# Patient Record
Sex: Female | Born: 1950 | Race: White | Hispanic: No | Marital: Married | Smoking: Current every day smoker
Health system: Southern US, Community
[De-identification: ages and names within clinical notes are randomized; demographics above are authoritative.]

## PROBLEM LIST (undated history)

## (undated) DIAGNOSIS — G459 Transient cerebral ischemic attack, unspecified: Secondary | ICD-10-CM

## (undated) DIAGNOSIS — F419 Anxiety disorder, unspecified: Secondary | ICD-10-CM

## (undated) DIAGNOSIS — F32A Depression, unspecified: Secondary | ICD-10-CM

## (undated) DIAGNOSIS — J189 Pneumonia, unspecified organism: Secondary | ICD-10-CM

## (undated) DIAGNOSIS — M1990 Unspecified osteoarthritis, unspecified site: Secondary | ICD-10-CM

## (undated) DIAGNOSIS — I639 Cerebral infarction, unspecified: Secondary | ICD-10-CM

## (undated) DIAGNOSIS — E039 Hypothyroidism, unspecified: Secondary | ICD-10-CM

## (undated) DIAGNOSIS — N189 Chronic kidney disease, unspecified: Secondary | ICD-10-CM

## (undated) DIAGNOSIS — K219 Gastro-esophageal reflux disease without esophagitis: Secondary | ICD-10-CM

## (undated) DIAGNOSIS — J40 Bronchitis, not specified as acute or chronic: Secondary | ICD-10-CM

## (undated) DIAGNOSIS — I10 Essential (primary) hypertension: Secondary | ICD-10-CM

## (undated) DIAGNOSIS — R0600 Dyspnea, unspecified: Secondary | ICD-10-CM

## (undated) DIAGNOSIS — I77810 Thoracic aortic ectasia: Secondary | ICD-10-CM

## (undated) DIAGNOSIS — F329 Major depressive disorder, single episode, unspecified: Secondary | ICD-10-CM

## (undated) DIAGNOSIS — T7840XA Allergy, unspecified, initial encounter: Secondary | ICD-10-CM

## (undated) DIAGNOSIS — J449 Chronic obstructive pulmonary disease, unspecified: Secondary | ICD-10-CM

## (undated) HISTORY — DX: Allergy, unspecified, initial encounter: T78.40XA

## (undated) HISTORY — PX: CARDIAC CATHETERIZATION: SHX172

## (undated) HISTORY — PX: TOTAL SHOULDER ARTHROPLASTY: SHX126

## (undated) HISTORY — PX: THYROIDECTOMY: SHX17

## (undated) HISTORY — PX: CHOLECYSTECTOMY: SHX55

## (undated) HISTORY — DX: Chronic kidney disease, unspecified: N18.9

## (undated) HISTORY — DX: Cerebral infarction, unspecified: I63.9

## (undated) HISTORY — PX: OTHER SURGICAL HISTORY: SHX169

## (undated) HISTORY — PX: TOTAL HIP ARTHROPLASTY: SHX124

## (undated) HISTORY — PX: ABDOMINAL HYSTERECTOMY: SHX81

---

## 2009-08-12 DIAGNOSIS — I639 Cerebral infarction, unspecified: Secondary | ICD-10-CM

## 2009-08-12 HISTORY — DX: Cerebral infarction, unspecified: I63.9

## 2014-02-28 ENCOUNTER — Encounter: Payer: Self-pay | Admitting: Gastroenterology

## 2014-02-28 HISTORY — PX: COLONOSCOPY: SHX174

## 2016-06-05 DIAGNOSIS — G8929 Other chronic pain: Secondary | ICD-10-CM | POA: Insufficient documentation

## 2016-06-05 DIAGNOSIS — F17200 Nicotine dependence, unspecified, uncomplicated: Secondary | ICD-10-CM

## 2016-06-05 DIAGNOSIS — M5441 Lumbago with sciatica, right side: Secondary | ICD-10-CM

## 2016-06-05 DIAGNOSIS — M5442 Lumbago with sciatica, left side: Secondary | ICD-10-CM

## 2016-06-05 HISTORY — DX: Nicotine dependence, unspecified, uncomplicated: F17.200

## 2016-06-05 HISTORY — DX: Other chronic pain: G89.29

## 2016-07-01 DIAGNOSIS — J449 Chronic obstructive pulmonary disease, unspecified: Secondary | ICD-10-CM | POA: Insufficient documentation

## 2016-07-01 DIAGNOSIS — I10 Essential (primary) hypertension: Secondary | ICD-10-CM

## 2016-07-01 DIAGNOSIS — R7309 Other abnormal glucose: Secondary | ICD-10-CM

## 2016-07-01 DIAGNOSIS — E039 Hypothyroidism, unspecified: Secondary | ICD-10-CM

## 2016-07-01 DIAGNOSIS — G4700 Insomnia, unspecified: Secondary | ICD-10-CM

## 2016-07-01 HISTORY — DX: Essential (primary) hypertension: I10

## 2016-07-01 HISTORY — DX: Hypothyroidism, unspecified: E03.9

## 2016-07-01 HISTORY — DX: Insomnia, unspecified: G47.00

## 2016-07-01 HISTORY — DX: Other abnormal glucose: R73.09

## 2016-11-06 DIAGNOSIS — I2510 Atherosclerotic heart disease of native coronary artery without angina pectoris: Secondary | ICD-10-CM

## 2016-11-06 HISTORY — DX: Atherosclerotic heart disease of native coronary artery without angina pectoris: I25.10

## 2016-11-20 ENCOUNTER — Other Ambulatory Visit: Payer: Self-pay | Admitting: Orthopedic Surgery

## 2016-11-20 DIAGNOSIS — E785 Hyperlipidemia, unspecified: Secondary | ICD-10-CM

## 2016-11-20 HISTORY — DX: Hyperlipidemia, unspecified: E78.5

## 2016-11-26 ENCOUNTER — Ambulatory Visit (HOSPITAL_COMMUNITY)
Admission: RE | Admit: 2016-11-26 | Discharge: 2016-11-26 | Disposition: A | Discharge status: Home / Self Care | Payer: Medicare HMO | Source: Ambulatory Visit | Attending: Orthopedic Surgery | Admitting: Orthopedic Surgery

## 2016-11-26 ENCOUNTER — Encounter (HOSPITAL_COMMUNITY): Payer: Self-pay | Admitting: *Deleted

## 2016-11-26 ENCOUNTER — Encounter (HOSPITAL_COMMUNITY)
Admission: RE | Admit: 2016-11-26 | Discharge: 2016-11-26 | Disposition: A | Discharge status: Home / Self Care | Payer: Medicare HMO | Source: Ambulatory Visit | Attending: Orthopedic Surgery | Admitting: Orthopedic Surgery

## 2016-11-26 DIAGNOSIS — Z01810 Encounter for preprocedural cardiovascular examination: Secondary | ICD-10-CM | POA: Insufficient documentation

## 2016-11-26 DIAGNOSIS — Z01812 Encounter for preprocedural laboratory examination: Secondary | ICD-10-CM | POA: Diagnosis not present

## 2016-11-26 DIAGNOSIS — Z01818 Encounter for other preprocedural examination: Secondary | ICD-10-CM

## 2016-11-26 HISTORY — DX: Depression, unspecified: F32.A

## 2016-11-26 HISTORY — DX: Bronchitis, not specified as acute or chronic: J40

## 2016-11-26 HISTORY — DX: Transient cerebral ischemic attack, unspecified: G45.9

## 2016-11-26 HISTORY — DX: Gastro-esophageal reflux disease without esophagitis: K21.9

## 2016-11-26 HISTORY — DX: Dyspnea, unspecified: R06.00

## 2016-11-26 HISTORY — DX: Anxiety disorder, unspecified: F41.9

## 2016-11-26 HISTORY — DX: Hypothyroidism, unspecified: E03.9

## 2016-11-26 HISTORY — DX: Unspecified osteoarthritis, unspecified site: M19.90

## 2016-11-26 HISTORY — DX: Major depressive disorder, single episode, unspecified: F32.9

## 2016-11-26 HISTORY — DX: Pneumonia, unspecified organism: J18.9

## 2016-11-26 HISTORY — DX: Essential (primary) hypertension: I10

## 2016-11-26 HISTORY — DX: Chronic obstructive pulmonary disease, unspecified: J44.9

## 2016-11-26 HISTORY — DX: Thoracic aortic ectasia: I77.810

## 2016-11-26 LAB — CBC WITH DIFFERENTIAL/PLATELET
Basophils Absolute: 0 10*3/uL (ref 0.0–0.1)
Basophils Relative: 0 %
EOS ABS: 0.1 10*3/uL (ref 0.0–0.7)
Eosinophils Relative: 1 %
HCT: 46.3 % — ABNORMAL HIGH (ref 36.0–46.0)
HEMOGLOBIN: 15.2 g/dL — AB (ref 12.0–15.0)
LYMPHS ABS: 2.2 10*3/uL (ref 0.7–4.0)
Lymphocytes Relative: 24 %
MCH: 29.2 pg (ref 26.0–34.0)
MCHC: 32.8 g/dL (ref 30.0–36.0)
MCV: 89 fL (ref 78.0–100.0)
Monocytes Absolute: 0.6 10*3/uL (ref 0.1–1.0)
Monocytes Relative: 6 %
NEUTROS ABS: 6.4 10*3/uL (ref 1.7–7.7)
NEUTROS PCT: 69 %
Platelets: 246 10*3/uL (ref 150–400)
RBC: 5.2 MIL/uL — AB (ref 3.87–5.11)
RDW: 14.8 % (ref 11.5–15.5)
WBC: 9.2 10*3/uL (ref 4.0–10.5)

## 2016-11-26 LAB — SURGICAL PCR SCREEN
MRSA, PCR: NEGATIVE
Staphylococcus aureus: NEGATIVE

## 2016-11-26 LAB — BASIC METABOLIC PANEL
ANION GAP: 8 (ref 5–15)
BUN: 26 mg/dL — ABNORMAL HIGH (ref 6–20)
CHLORIDE: 106 mmol/L (ref 101–111)
CO2: 25 mmol/L (ref 22–32)
CREATININE: 0.77 mg/dL (ref 0.44–1.00)
Calcium: 8.7 mg/dL — ABNORMAL LOW (ref 8.9–10.3)
GFR calc non Af Amer: >60 mL/min (ref >60)
Glucose, Bld: 120 mg/dL — ABNORMAL HIGH (ref 65–99)
POTASSIUM: 3.2 mmol/L — AB (ref 3.5–5.1)
SODIUM: 139 mmol/L (ref 135–145)

## 2016-11-26 LAB — URINALYSIS, ROUTINE W REFLEX MICROSCOPIC
Bilirubin Urine: NEGATIVE
Glucose, UA: NEGATIVE mg/dL
HGB URINE DIPSTICK: NEGATIVE
KETONES UR: NEGATIVE mg/dL
LEUKOCYTES UA: NEGATIVE
Nitrite: NEGATIVE
PROTEIN: NEGATIVE mg/dL
Specific Gravity, Urine: 1.026 (ref 1.005–1.030)
pH: 5 (ref 5.0–8.0)

## 2016-11-26 LAB — PROTIME-INR
INR: 0.96
PROTHROMBIN TIME: 12.8 s (ref 11.4–15.2)

## 2016-11-26 LAB — ABO/RH: ABO/RH(D): B POS

## 2016-11-26 LAB — APTT: aPTT: 27 seconds (ref 24–36)

## 2016-11-26 NOTE — Progress Notes (Signed)
PCP - Janie Morning Cardiologist - [place]  Chest x-ray - 11/26/16 EKG - 11/20/16 reqesting Stress Test - requesting ECHO - requesting Cardiac Cath - denies    Sending to anesthesia for review of records   Patient denies shortness of breath, fever, cough and chest pain at PAT appointment   Patient verbalized understanding of instructions that was given to them at the PAT appointment. Patient expressed that there were no further questions.  Patient was also instructed that they will need to review over the PAT instructions again at home before the surgery.

## 2016-11-26 NOTE — Pre-Procedure Instructions (Signed)
Judy Cooper  11/26/2016      [facility], [place], [place] [address] Phone: [phone] Fax: [phone]    Your procedure is scheduled on April 23  Report to [facility] at 0700 A.M.  Call this number if you have problems the morning of surgery:  [phone]   Remember:  Do not eat food or drink liquids after midnight.   Take these medicines the morning of surgery with A SIP OF WATER albuterol (PROVENTIL HFA;VENTOLIN HFA), DULoxetine (CYMBALTA), esomeprazole (NEXIUM), HYDROcodone-acetaminophen (NORCO), levothyroxine (SYNTHROID, LEVOTHROID)   Take all other medications as prescribed except 7 days prior to surgery STOP taking any Aspirin, Aleve, Naproxen, Ibuprofen, Motrin, Advil, Goody's, BC's, all herbal medications, fish oil, and all vitamins    Do not wear jewelry, make-up or nail polish.  Do not wear lotions, powders, or perfumes, or deoderant.  Do not shave 48 hours prior to surgery.  Men may shave face and neck.  Do not bring valuables to the hospital.  [facility] is not responsible for any belongings or valuables.  Contacts, dentures or bridgework may not be worn into surgery.  Leave your suitcase in the car.  After surgery it may be brought to your room.  For patients admitted to the hospital, discharge time will be determined by your treatment team.  Patients discharged the day of surgery will not be allowed to drive home.    Special instructions:   - Preparing For Surgery  Before surgery, you can play an important role. Because skin is not sterile, your skin needs to be as free of germs as possible. You can reduce the number of germs on your skin by washing with CHG (chlorahexidine gluconate) Soap before surgery.  CHG is an antiseptic cleaner which kills germs and bonds with the skin to continue killing germs even after washing.  Please do not use if you have an allergy  to CHG or antibacterial soaps. If your skin becomes reddened/irritated stop using the CHG.  Do not shave (including legs and underarms) for at least 48 hours prior to first CHG shower. It is OK to shave your face.  Please follow these instructions carefully.   1. Shower the NIGHT BEFORE SURGERY and the MORNING OF SURGERY with CHG.   2. If you chose to wash your hair, wash your hair first as usual with your normal shampoo.  3. After you shampoo, rinse your hair and body thoroughly to remove the shampoo.  4. Use CHG as you would any other liquid soap. You can apply CHG directly to the skin and wash gently with a scrungie or a clean washcloth.   5. Apply the CHG Soap to your body ONLY FROM THE NECK DOWN.  Do not use on open wounds or open sores. Avoid contact with your eyes, ears, mouth and genitals (private parts). Wash genitals (private parts) with your normal soap.  6. Wash thoroughly, paying special attention to the area where your surgery will be performed.  7. Thoroughly rinse your body with warm water from the neck down.  8. DO NOT shower/wash with your normal soap after using and rinsing off the CHG Soap.  9. Pat yourself dry with a CLEAN TOWEL.   10. Wear CLEAN PAJAMAS   11. Place CLEAN SHEETS on your bed the night of your first shower and DO NOT SLEEP WITH PETS.    Day of Surgery:  Do not apply any deodorants/lotions. Please wear clean clothes to the hospital/surgery center.      Please read over the following fact sheets that you were given.

## 2016-11-27 DIAGNOSIS — M1612 Unilateral primary osteoarthritis, left hip: Secondary | ICD-10-CM

## 2016-11-27 HISTORY — DX: Unilateral primary osteoarthritis, left hip: M16.12

## 2016-11-27 NOTE — H&P (Signed)
TOTAL HIP ADMISSION H&P  Patient is admitted for left total hip arthroplasty.  Subjective:  Chief Complaint: left hip pain  HPI: Judy Cooper, 66 y.o. female, has a history of pain and functional disability in the left hip(s) due to arthritis and patient has failed non-surgical conservative treatments for greater than 12 weeks to include NSAID's and/or analgesics, use of assistive devices, weight reduction as appropriate and activity modification.  Onset of symptoms was gradual starting several years ago with gradually worsening course since that time.The patient noted no past surgery on the left hip(s).  Patient currently rates pain in the left hip at 10 out of 10 with activity. Patient has night pain, worsening of pain with activity and weight bearing, trendelenberg gait, pain that interfers with activities of daily living and pain with passive range of motion. Patient has evidence of joint space narrowing by imaging studies. This condition presents safety issues increasing the risk of falls.   There is no current active infection.  There are no active problems to display for this patient.  Past Medical History:  Diagnosis Date  . Anxiety   . Arthritis   . Bronchitis    hx  . COPD (chronic obstructive pulmonary disease) ([place])   . Depression   . Dyspnea    occasionally  . GERD (gastroesophageal reflux disease)   . Hypertension   . Hypothyroidism   . Pneumonia    hx  . TIA (transient ischemic attack)     Past Surgical History:  Procedure Laterality Date  . ABDOMINAL HYSTERECTOMY    . CHOLECYSTECTOMY    . COLONOSCOPY    . goiter surgery left side of neck    . THYROIDECTOMY    . TOTAL HIP ARTHROPLASTY Right   . TOTAL SHOULDER ARTHROPLASTY Right     No prescriptions prior to admission.   No Known Allergies  Social History  Substance Use Topics  . Smoking status: Former Research scientist (life sciences)  . Smokeless tobacco: Former Systems developer     Comment: quit 08/2016  . Alcohol use No    No family  history on file.   Review of Systems  HENT: Positive for nosebleeds.   Eyes:       Poor vision  Cardiovascular:       HTN  Gastrointestinal:       Poor appetite, bowel changes  Genitourinary: Positive for frequency and urgency.  Musculoskeletal: Positive for joint pain and myalgias.  Neurological: Positive for tremors.       Poor balance  Endo/Heme/Allergies: Positive for polydipsia. Bruises/bleeds easily.  Psychiatric/Behavioral: Positive for memory loss and suicidal ideas. The patient has insomnia.     Objective:  Physical Exam  Constitutional: She is oriented to person, place, and time. She appears well-developed and well-nourished.  HENT:  Head: Normocephalic and atraumatic.  Neck: Normal range of motion. Neck supple.  Respiratory: Effort normal.  Musculoskeletal: She exhibits tenderness.  Very irritable left hip to any attempts at internal or external rotation.  Foot tap is negative.  Neurovascularly intact distally.  Skin is intact no cuts, scrapes or abrasions.  Surgical scar to the right hip is a posterior lateral approach well-healed internal and external rotation are 30 each foot tap is negative on the right.  Neurological: She is alert and oriented to person, place, and time.  Skin: Skin is warm and dry.  Psychiatric: She has a normal mood and affect. Her behavior is normal. Judgment and thought content normal.    Vital signs in last  24 hours: Temp:  [98.4 F (36.9 C)] 98.4 F (36.9 C) (04/17 1507) Pulse Rate:  [86] 86 (04/17 1507) Resp:  [20] 20 (04/17 1507) BP: (142)/(80) 142/80 (04/17 1507) SpO2:  [95 %] 95 % (04/17 1507) Weight:  [70.9 kg (156 lb 3.2 oz)] 70.9 kg (156 lb 3.2 oz) (04/17 1507)  Labs:   Estimated body mass index is 27.67 kg/m as calculated from the following:   Height as of 11/26/16: 5\' 3"  (1.6 m).   Weight as of 11/26/16: 70.9 kg (156 lb 3.2 oz).   Imaging Review Plain radiographs demonstrate AP pelvis and crosstable lateral of the  left hip and femur show erosive bone-on-bone end-stage arthritis of the left hip which is about half inch shorter than the right hip.  Assessment/Plan:  End stage arthritis, left hip(s)  The patient history, physical examination, clinical judgement of the provider and imaging studies are consistent with end stage degenerative joint disease of the left hip(s) and total hip arthroplasty is deemed medically necessary. The treatment options including medical management, injection therapy, arthroscopy and arthroplasty were discussed at length. The risks and benefits of total hip arthroplasty were presented and reviewed. The risks due to aseptic loosening, infection, stiffness, dislocation/subluxation,  thromboembolic complications and other imponderables were discussed.  The patient acknowledged the explanation, agreed to proceed with the plan and consent was signed. Patient is being admitted for inpatient treatment for surgery, pain control, PT, OT, prophylactic antibiotics, VTE prophylaxis, progressive ambulation and ADL's and discharge planning.The patient is planning to be discharged home with home health services

## 2016-11-28 ENCOUNTER — Encounter (HOSPITAL_COMMUNITY): Payer: Self-pay

## 2016-11-28 LAB — TYPE AND SCREEN
ABO/RH(D): B POS
ANTIBODY SCREEN: NEGATIVE

## 2016-11-28 NOTE — Progress Notes (Signed)
Anesthesia chart review: Patient is a 66 year old female scheduled for left anterior hip arthroplasty on 12/02/2016 by Dr. Mayer Camel.  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  PCP is Dr. Janie Morning with [facility] ([facility]). She had recommended cardiology clearance since patient had not been seen in > 3 years.  Cardiologist is Dr. Jyl Heinz, last visit 11/20/16. He ordered a stress test and "If this is negative then she is not at high risk for coronary events during the aforementioned surgery." (Non-ischemic stress test 11/26/16.) He also ordered a chest CT to re-evaluate ascending thoracic aorta dilation (4 cm 11/25/16 CT).  Meds include albuterol, Lipitor, Cymbalta, Nexium, Norco, levothyroxine, Hyzaar, trazodone.  BP (!) 142/80   Pulse 86   Temp 36.9 C   Resp 20   Ht 5\' 3"  (1.6 m)   Wt 156 lb 3.2 oz (70.9 kg)   SpO2 95%   BMI 27.67 kg/m   EKG 11/26/16 (as part of stress test): NSR, poor r wave progression, rightward axis, non-specific ST/T abnormality.   Nuclear stress test 11/26/16 [facility]): Impressions: 1. Cardiolite images do not reveal any evidence of ischemia. 2. Reserve systolic function. The calculated EF is 62%.  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 Normal LM, LAD, LCx.  Recommendation: Work-up for non-cardiac causes of symptoms. CTA of chest to evaluate mildly dilated ascending aorta (measures 3.8 cm in cath lab).  Cardiology notes mention history of coronary angiography a few years ago-will request from [facility].  Chest x-ray 11/26/2016: IMPRESSION: No active cardiopulmonary disease.  Chest CT w/ contrast 11/25/16 [facility];  report found in PACS): Impression: 1. The ascending thoracic aorta now measures 4 cm in maximum diameter. Recommend annual imaging follow-up by CTA or MRA.  2. Tiny pulmonology nodules are identified within the right lung and is unchanged from previous exam. The 3 mm perifissural nodule is new from previous exam. No follow-up needed if patient is low risk. Noncontrast CT can be considered in 12 months the patient is high risk. 3. Diffuse bronchial wall thickening with emphysema, findings suggestive of underlying COPD.  Preoperative labs noted.  If no acute changes then I anticipate that she can proceed as planned.  [facility] Short Stay Center/Anesthesiology Phone [phone] 11/28/2016 4:48 PM

## 2016-11-29 MED ORDER — BUPIVACAINE LIPOSOME 1.3 % IJ SUSP
20.0000 mL | Freq: Once | INTRAMUSCULAR | Status: AC
  Administered 2016-12-02: 20 mL
  Filled 2016-11-29: qty 20

## 2016-11-29 MED ORDER — TRANEXAMIC ACID 1000 MG/10ML IV SOLN
2000.0000 mg | INTRAVENOUS | Status: AC
  Administered 2016-12-02: 2000 mg via TOPICAL
  Filled 2016-11-29: qty 20

## 2016-11-29 MED ORDER — TRANEXAMIC ACID 1000 MG/10ML IV SOLN
1000.0000 mg | INTRAVENOUS | Status: AC
  Administered 2016-12-02: 1000 mg via INTRAVENOUS
  Filled 2016-11-29: qty 10

## 2016-12-01 NOTE — Anesthesia Preprocedure Evaluation (Addendum)
Anesthesia Evaluation  Patient identified by MRN, date of birth, ID band Patient awake    Reviewed: Allergy & Precautions, H&P , NPO status , Patient's Chart, lab work & pertinent test results  Airway Mallampati: II  TM Distance: >3 FB Neck ROM: Full    Dental no notable dental hx. (+) Edentulous Upper, Partial Lower, Dental Advisory Given   Pulmonary COPD,  COPD inhaler, former smoker,    Pulmonary exam normal breath sounds clear to auscultation- rhonchi       Cardiovascular Exercise Tolerance: Good hypertension, Pt. on medications + Peripheral Vascular Disease   Rhythm:Regular Rate:Normal     Neuro/Psych Anxiety Depression TIAnegative psych ROS   GI/Hepatic Neg liver ROS, GERD  Medicated and Controlled,  Endo/Other  Hypothyroidism   Renal/GU negative Renal ROS  negative genitourinary   Musculoskeletal  (+) Arthritis , Osteoarthritis,    Abdominal   Peds  Hematology negative hematology ROS (+)   Anesthesia Other Findings   Reproductive/Obstetrics negative OB ROS                            Anesthesia Physical Anesthesia Plan  ASA: III  Anesthesia Plan: MAC and Spinal   Post-op Pain Management:    Induction: Intravenous  Airway Management Planned: Simple Face Mask  Additional Equipment:   Intra-op Plan:   Post-operative Plan:   Informed Consent: I have reviewed the patients History and Physical, chart, labs and discussed the procedure including the risks, benefits and alternatives for the proposed anesthesia with the patient or authorized representative who has indicated his/her understanding and acceptance.   Dental advisory given  Plan Discussed with: CRNA  Anesthesia Plan Comments:        Anesthesia Quick Evaluation

## 2016-12-02 ENCOUNTER — Inpatient Hospital Stay (HOSPITAL_COMMUNITY): Payer: Medicare HMO | Admitting: Anesthesiology

## 2016-12-02 ENCOUNTER — Inpatient Hospital Stay (HOSPITAL_COMMUNITY)
Admission: RE | Admit: 2016-12-02 | Discharge: 2016-12-04 | DRG: 470 | Disposition: A | Discharge status: Home / Self Care | Payer: Medicare HMO | Source: Ambulatory Visit | Attending: Orthopedic Surgery | Admitting: Orthopedic Surgery

## 2016-12-02 ENCOUNTER — Inpatient Hospital Stay (HOSPITAL_COMMUNITY): Payer: Medicare HMO

## 2016-12-02 ENCOUNTER — Encounter (HOSPITAL_COMMUNITY): Payer: Self-pay

## 2016-12-02 ENCOUNTER — Inpatient Hospital Stay (HOSPITAL_COMMUNITY): Payer: Medicare HMO | Admitting: Emergency Medicine

## 2016-12-02 ENCOUNTER — Encounter (HOSPITAL_COMMUNITY)
Admission: RE | Disposition: A | Discharge status: Home / Self Care | Payer: Self-pay | Source: Ambulatory Visit | Attending: Orthopedic Surgery

## 2016-12-02 DIAGNOSIS — I77819 Aortic ectasia, unspecified site: Secondary | ICD-10-CM | POA: Diagnosis present

## 2016-12-02 DIAGNOSIS — K219 Gastro-esophageal reflux disease without esophagitis: Secondary | ICD-10-CM | POA: Diagnosis present

## 2016-12-02 DIAGNOSIS — R918 Other nonspecific abnormal finding of lung field: Secondary | ICD-10-CM | POA: Insufficient documentation

## 2016-12-02 DIAGNOSIS — M1612 Unilateral primary osteoarthritis, left hip: Principal | ICD-10-CM | POA: Diagnosis present

## 2016-12-02 DIAGNOSIS — Z8673 Personal history of transient ischemic attack (TIA), and cerebral infarction without residual deficits: Secondary | ICD-10-CM | POA: Diagnosis not present

## 2016-12-02 DIAGNOSIS — Z8701 Personal history of pneumonia (recurrent): Secondary | ICD-10-CM | POA: Diagnosis not present

## 2016-12-02 DIAGNOSIS — Z90710 Acquired absence of both cervix and uterus: Secondary | ICD-10-CM | POA: Diagnosis not present

## 2016-12-02 DIAGNOSIS — D62 Acute posthemorrhagic anemia: Secondary | ICD-10-CM | POA: Diagnosis not present

## 2016-12-02 DIAGNOSIS — Z87891 Personal history of nicotine dependence: Secondary | ICD-10-CM

## 2016-12-02 DIAGNOSIS — Z96641 Presence of right artificial hip joint: Secondary | ICD-10-CM | POA: Diagnosis present

## 2016-12-02 DIAGNOSIS — J449 Chronic obstructive pulmonary disease, unspecified: Secondary | ICD-10-CM | POA: Diagnosis present

## 2016-12-02 DIAGNOSIS — Z885 Allergy status to narcotic agent status: Secondary | ICD-10-CM

## 2016-12-02 DIAGNOSIS — I739 Peripheral vascular disease, unspecified: Secondary | ICD-10-CM | POA: Diagnosis present

## 2016-12-02 DIAGNOSIS — Z96611 Presence of right artificial shoulder joint: Secondary | ICD-10-CM | POA: Diagnosis present

## 2016-12-02 DIAGNOSIS — E890 Postprocedural hypothyroidism: Secondary | ICD-10-CM | POA: Diagnosis present

## 2016-12-02 DIAGNOSIS — Z419 Encounter for procedure for purposes other than remedying health state, unspecified: Secondary | ICD-10-CM

## 2016-12-02 DIAGNOSIS — I10 Essential (primary) hypertension: Secondary | ICD-10-CM | POA: Diagnosis present

## 2016-12-02 DIAGNOSIS — F329 Major depressive disorder, single episode, unspecified: Secondary | ICD-10-CM | POA: Diagnosis present

## 2016-12-02 DIAGNOSIS — G8918 Other acute postprocedural pain: Secondary | ICD-10-CM

## 2016-12-02 DIAGNOSIS — I2510 Atherosclerotic heart disease of native coronary artery without angina pectoris: Secondary | ICD-10-CM | POA: Diagnosis present

## 2016-12-02 DIAGNOSIS — F419 Anxiety disorder, unspecified: Secondary | ICD-10-CM | POA: Diagnosis present

## 2016-12-02 DIAGNOSIS — Z9049 Acquired absence of other specified parts of digestive tract: Secondary | ICD-10-CM | POA: Diagnosis not present

## 2016-12-02 HISTORY — DX: Unilateral primary osteoarthritis, left hip: M16.12

## 2016-12-02 HISTORY — DX: Other nonspecific abnormal finding of lung field: R91.8

## 2016-12-02 HISTORY — PX: TOTAL HIP ARTHROPLASTY: SHX124

## 2016-12-02 SURGERY — ARTHROPLASTY, HIP, TOTAL, ANTERIOR APPROACH
Anesthesia: Monitor Anesthesia Care | Laterality: Left

## 2016-12-02 MED ORDER — FLEET ENEMA 7-19 GM/118ML RE ENEM: 1.0000 | ENEMA | Freq: Once | RECTAL | Status: DC | PRN

## 2016-12-02 MED ORDER — LACTATED RINGERS IV SOLN
INTRAVENOUS | Status: DC
Start: 2016-12-02 — End: 2016-12-02
  Administered 2016-12-02 (×2): via INTRAVENOUS

## 2016-12-02 MED ORDER — EPHEDRINE 5 MG/ML INJ
INTRAVENOUS | Status: AC
  Filled 2016-12-02: qty 20

## 2016-12-02 MED ORDER — OXYCODONE HCL 5 MG PO TABS
ORAL_TABLET | ORAL | Status: AC
  Filled 2016-12-02: qty 2

## 2016-12-02 MED ORDER — HYDROMORPHONE HCL 1 MG/ML IJ SOLN
1.0000 mg | INTRAMUSCULAR | Status: DC | PRN
  Administered 2016-12-02 (×2): 1 mg via INTRAVENOUS
  Filled 2016-12-02 (×3): qty 1

## 2016-12-02 MED ORDER — MIDAZOLAM HCL 2 MG/2ML IJ SOLN
INTRAMUSCULAR | Status: AC
  Filled 2016-12-02: qty 2

## 2016-12-02 MED ORDER — EPHEDRINE SULFATE 50 MG/ML IJ SOLN
INTRAMUSCULAR | Status: DC | PRN
  Administered 2016-12-02 (×2): 5 mg via INTRAVENOUS

## 2016-12-02 MED ORDER — LOSARTAN POTASSIUM-HCTZ 50-12.5 MG PO TABS: 1.0000 | ORAL_TABLET | Freq: Every day | ORAL | Status: DC

## 2016-12-02 MED ORDER — PHENYLEPHRINE HCL 10 MG/ML IJ SOLN
INTRAMUSCULAR | Status: DC | PRN
  Administered 2016-12-02: 30 ug/min via INTRAVENOUS

## 2016-12-02 MED ORDER — LEVOTHYROXINE SODIUM 25 MCG PO TABS
25.0000 ug | ORAL_TABLET | Freq: Every day | ORAL | Status: DC
  Administered 2016-12-03 – 2016-12-04 (×2): 25 ug via ORAL
  Filled 2016-12-02 (×2): qty 1

## 2016-12-02 MED ORDER — METOCLOPRAMIDE HCL 5 MG PO TABS
5.0000 mg | ORAL_TABLET | Freq: Three times a day (TID) | ORAL | Status: DC | PRN
  Administered 2016-12-02: 10 mg via ORAL
  Filled 2016-12-02: qty 2

## 2016-12-02 MED ORDER — DIPHENHYDRAMINE HCL 12.5 MG/5ML PO ELIX: 12.5000 mg | ORAL_SOLUTION | ORAL | Status: DC | PRN

## 2016-12-02 MED ORDER — ALBUTEROL SULFATE (2.5 MG/3ML) 0.083% IN NEBU: 3.0000 mL | INHALATION_SOLUTION | Freq: Four times a day (QID) | RESPIRATORY_TRACT | Status: DC | PRN

## 2016-12-02 MED ORDER — CHLORHEXIDINE GLUCONATE 4 % EX LIQD: 60.0000 mL | Freq: Once | CUTANEOUS | Status: DC

## 2016-12-02 MED ORDER — ONDANSETRON HCL 4 MG/2ML IJ SOLN: 4.0000 mg | Freq: Four times a day (QID) | INTRAMUSCULAR | Status: DC | PRN

## 2016-12-02 MED ORDER — FENTANYL CITRATE (PF) 250 MCG/5ML IJ SOLN
INTRAMUSCULAR | Status: AC
  Filled 2016-12-02: qty 5

## 2016-12-02 MED ORDER — KCL IN DEXTROSE-NACL 20-5-0.45 MEQ/L-%-% IV SOLN: INTRAVENOUS | Status: DC

## 2016-12-02 MED ORDER — PHENOL 1.4 % MT LIQD: 1.0000 | OROMUCOSAL | Status: DC | PRN

## 2016-12-02 MED ORDER — DULOXETINE HCL 30 MG PO CPEP
30.0000 mg | ORAL_CAPSULE | Freq: Every day | ORAL | Status: DC
  Administered 2016-12-02 – 2016-12-04 (×3): 30 mg via ORAL
  Filled 2016-12-02 (×3): qty 1

## 2016-12-02 MED ORDER — FENTANYL CITRATE (PF) 100 MCG/2ML IJ SOLN
50.0000 ug | INTRAMUSCULAR | Status: AC | PRN
  Administered 2016-12-02 (×2): 50 ug via INTRAVENOUS

## 2016-12-02 MED ORDER — MENTHOL 3 MG MT LOZG: 1.0000 | LOZENGE | OROMUCOSAL | Status: DC | PRN

## 2016-12-02 MED ORDER — ONDANSETRON HCL 4 MG PO TABS: 4.0000 mg | ORAL_TABLET | Freq: Four times a day (QID) | ORAL | Status: DC | PRN

## 2016-12-02 MED ORDER — HYDROCHLOROTHIAZIDE 25 MG PO TABS
25.0000 mg | ORAL_TABLET | Freq: Every day | ORAL | Status: DC
  Administered 2016-12-03 – 2016-12-04 (×2): 25 mg via ORAL
  Filled 2016-12-02 (×2): qty 1

## 2016-12-02 MED ORDER — BISACODYL 5 MG PO TBEC: 5.0000 mg | DELAYED_RELEASE_TABLET | Freq: Every day | ORAL | Status: DC | PRN

## 2016-12-02 MED ORDER — OXYCODONE-ACETAMINOPHEN 5-325 MG PO TABS: 1.0000 | ORAL_TABLET | ORAL | 0 refills | Status: DC | PRN

## 2016-12-02 MED ORDER — ACETAMINOPHEN 650 MG RE SUPP: 650.0000 mg | Freq: Four times a day (QID) | RECTAL | Status: DC | PRN

## 2016-12-02 MED ORDER — ASPIRIN EC 325 MG PO TBEC
325.0000 mg | DELAYED_RELEASE_TABLET | Freq: Every day | ORAL | Status: DC
  Administered 2016-12-03 – 2016-12-04 (×2): 325 mg via ORAL
  Filled 2016-12-02 (×2): qty 1

## 2016-12-02 MED ORDER — LIDOCAINE 2% (20 MG/ML) 5 ML SYRINGE
INTRAMUSCULAR | Status: AC
  Filled 2016-12-02: qty 10

## 2016-12-02 MED ORDER — BUPIVACAINE IN DEXTROSE 0.75-8.25 % IT SOLN
INTRATHECAL | Status: DC | PRN
  Administered 2016-12-02: 15 mg via INTRATHECAL

## 2016-12-02 MED ORDER — POLYETHYLENE GLYCOL 3350 17 G PO PACK: 17.0000 g | PACK | Freq: Every day | ORAL | Status: DC | PRN

## 2016-12-02 MED ORDER — FENTANYL CITRATE (PF) 100 MCG/2ML IJ SOLN
INTRAMUSCULAR | Status: AC
  Administered 2016-12-02: 50 ug via INTRAVENOUS
  Filled 2016-12-02: qty 2

## 2016-12-02 MED ORDER — PHENYLEPHRINE 40 MCG/ML (10ML) SYRINGE FOR IV PUSH (FOR BLOOD PRESSURE SUPPORT)
PREFILLED_SYRINGE | INTRAVENOUS | Status: AC
  Filled 2016-12-02: qty 30

## 2016-12-02 MED ORDER — ALUMINUM HYDROXIDE GEL 320 MG/5ML PO SUSP
15.0000 mL | ORAL | Status: DC | PRN
  Filled 2016-12-02: qty 30

## 2016-12-02 MED ORDER — OXYCODONE HCL 5 MG PO TABS
ORAL_TABLET | ORAL | Status: AC
  Administered 2016-12-02: 10 mg via ORAL
  Filled 2016-12-02: qty 1

## 2016-12-02 MED ORDER — METOCLOPRAMIDE HCL 5 MG/ML IJ SOLN: 5.0000 mg | Freq: Three times a day (TID) | INTRAMUSCULAR | Status: DC | PRN

## 2016-12-02 MED ORDER — SUCCINYLCHOLINE CHLORIDE 200 MG/10ML IV SOSY
PREFILLED_SYRINGE | INTRAVENOUS | Status: AC
  Filled 2016-12-02: qty 10

## 2016-12-02 MED ORDER — PROPOFOL 500 MG/50ML IV EMUL
INTRAVENOUS | Status: DC | PRN
  Administered 2016-12-02: 50 ug/kg/min via INTRAVENOUS

## 2016-12-02 MED ORDER — METHOCARBAMOL 500 MG PO TABS
500.0000 mg | ORAL_TABLET | Freq: Four times a day (QID) | ORAL | Status: DC | PRN
Start: 2016-12-02 — End: 2016-12-04
  Administered 2016-12-02 – 2016-12-04 (×5): 500 mg via ORAL
  Filled 2016-12-02 (×5): qty 1

## 2016-12-02 MED ORDER — DEXTROSE-NACL 5-0.45 % IV SOLN: INTRAVENOUS | Status: DC

## 2016-12-02 MED ORDER — GABAPENTIN 300 MG PO CAPS
300.0000 mg | ORAL_CAPSULE | Freq: Three times a day (TID) | ORAL | Status: DC
Start: 2016-12-02 — End: 2016-12-04
  Administered 2016-12-02 – 2016-12-04 (×6): 300 mg via ORAL
  Filled 2016-12-02 (×6): qty 1

## 2016-12-02 MED ORDER — 0.9 % SODIUM CHLORIDE (POUR BTL) OPTIME
TOPICAL | Status: DC | PRN
  Administered 2016-12-02: 1000 mL

## 2016-12-02 MED ORDER — DOCUSATE SODIUM 100 MG PO CAPS
100.0000 mg | ORAL_CAPSULE | Freq: Two times a day (BID) | ORAL | Status: DC
  Administered 2016-12-02 – 2016-12-04 (×5): 100 mg via ORAL
  Filled 2016-12-02 (×5): qty 1

## 2016-12-02 MED ORDER — TRAZODONE HCL 100 MG PO TABS: 100.0000 mg | ORAL_TABLET | Freq: Every evening | ORAL | Status: DC | PRN

## 2016-12-02 MED ORDER — CEFAZOLIN SODIUM-DEXTROSE 2-4 GM/100ML-% IV SOLN
2.0000 g | INTRAVENOUS | Status: AC
  Administered 2016-12-02: 2 g via INTRAVENOUS
  Filled 2016-12-02: qty 100

## 2016-12-02 MED ORDER — ROCURONIUM BROMIDE 10 MG/ML (PF) SYRINGE
PREFILLED_SYRINGE | INTRAVENOUS | Status: AC
  Filled 2016-12-02: qty 5

## 2016-12-02 MED ORDER — METHOCARBAMOL 1000 MG/10ML IJ SOLN
500.0000 mg | Freq: Four times a day (QID) | INTRAVENOUS | Status: DC | PRN
  Filled 2016-12-02: qty 5

## 2016-12-02 MED ORDER — LOSARTAN POTASSIUM 50 MG PO TABS
50.0000 mg | ORAL_TABLET | Freq: Every day | ORAL | Status: DC
  Administered 2016-12-03 – 2016-12-04 (×2): 50 mg via ORAL
  Filled 2016-12-02 (×2): qty 1

## 2016-12-02 MED ORDER — OXYCODONE HCL 5 MG PO TABS
5.0000 mg | ORAL_TABLET | ORAL | Status: DC | PRN
  Administered 2016-12-02 – 2016-12-04 (×9): 10 mg via ORAL
  Filled 2016-12-02 (×8): qty 2

## 2016-12-02 MED ORDER — PHENYLEPHRINE HCL 10 MG/ML IJ SOLN
INTRAMUSCULAR | Status: DC | PRN
  Administered 2016-12-02: 80 ug via INTRAVENOUS
  Administered 2016-12-02: 120 ug via INTRAVENOUS
  Administered 2016-12-02: 40 ug via INTRAVENOUS
  Administered 2016-12-02 (×2): 80 ug via INTRAVENOUS

## 2016-12-02 MED ORDER — MIDAZOLAM HCL 5 MG/5ML IJ SOLN
INTRAMUSCULAR | Status: DC | PRN
  Administered 2016-12-02: 2 mg via INTRAVENOUS

## 2016-12-02 MED ORDER — DEXAMETHASONE SODIUM PHOSPHATE 10 MG/ML IJ SOLN
10.0000 mg | Freq: Once | INTRAMUSCULAR | Status: AC
  Administered 2016-12-03: 10 mg via INTRAVENOUS
  Filled 2016-12-02: qty 1

## 2016-12-02 MED ORDER — ACETAMINOPHEN 325 MG PO TABS
650.0000 mg | ORAL_TABLET | Freq: Four times a day (QID) | ORAL | Status: DC | PRN
  Administered 2016-12-02 – 2016-12-04 (×3): 650 mg via ORAL
  Filled 2016-12-02 (×3): qty 2

## 2016-12-02 MED ORDER — PANTOPRAZOLE SODIUM 40 MG PO TBEC
80.0000 mg | DELAYED_RELEASE_TABLET | Freq: Every day | ORAL | Status: DC
  Administered 2016-12-03: 80 mg via ORAL
  Filled 2016-12-02: qty 2

## 2016-12-02 MED ORDER — ASPIRIN EC 325 MG PO TBEC: 325.0000 mg | DELAYED_RELEASE_TABLET | Freq: Two times a day (BID) | ORAL | 0 refills | Status: DC

## 2016-12-02 MED ORDER — BUPIVACAINE-EPINEPHRINE (PF) 0.5% -1:200000 IJ SOLN
INTRAMUSCULAR | Status: DC | PRN
  Administered 2016-12-02: 50 mL via PERINEURAL

## 2016-12-02 MED ORDER — BUPIVACAINE HCL (PF) 0.5 % IJ SOLN
INTRAMUSCULAR | Status: AC
  Filled 2016-12-02: qty 60

## 2016-12-02 MED ORDER — EPINEPHRINE PF 1 MG/ML IJ SOLN
INTRAMUSCULAR | Status: AC
  Filled 2016-12-02: qty 1

## 2016-12-02 MED ORDER — HYDROMORPHONE HCL 1 MG/ML IJ SOLN: 0.2500 mg | INTRAMUSCULAR | Status: DC | PRN

## 2016-12-02 MED ORDER — TIZANIDINE HCL 2 MG PO TABS: 2.0000 mg | ORAL_TABLET | Freq: Four times a day (QID) | ORAL | 0 refills | Status: DC | PRN

## 2016-12-02 SURGICAL SUPPLY — 43 items
BAG DECANTER FOR FLEXI CONT (MISCELLANEOUS) ×3 IMPLANT
BLADE SAW SGTL 18X1.27X75 (BLADE) ×2 IMPLANT
BLADE SAW SGTL 18X1.27X75MM (BLADE) ×1
CAPT HIP TOTAL 2 ×3 IMPLANT
COVER PERINEAL POST (MISCELLANEOUS) ×3 IMPLANT
COVER SURGICAL LIGHT HANDLE (MISCELLANEOUS) ×3 IMPLANT
DRAPE C-ARM 42X72 X-RAY (DRAPES) ×3 IMPLANT
DRAPE STERI IOBAN 125X83 (DRAPES) ×3 IMPLANT
DRAPE U-SHAPE 47X51 STRL (DRAPES) ×6 IMPLANT
DRSG AQUACEL AG ADV 3.5X10 (GAUZE/BANDAGES/DRESSINGS) ×3 IMPLANT
DURAPREP 26ML APPLICATOR (WOUND CARE) ×3 IMPLANT
ELECT BLADE 4.0 EZ CLEAN MEGAD (MISCELLANEOUS) ×3
ELECT REM PT RETURN 9FT ADLT (ELECTROSURGICAL) ×3
ELECTRODE BLDE 4.0 EZ CLN MEGD (MISCELLANEOUS) ×1 IMPLANT
ELECTRODE REM PT RTRN 9FT ADLT (ELECTROSURGICAL) ×1 IMPLANT
FACESHIELD WRAPAROUND (MASK) ×6 IMPLANT
GLOVE BIO SURGEON STRL SZ7.5 (GLOVE) ×3 IMPLANT
GLOVE BIO SURGEON STRL SZ8.5 (GLOVE) ×3 IMPLANT
GLOVE BIOGEL PI IND STRL 8 (GLOVE) ×1 IMPLANT
GLOVE BIOGEL PI IND STRL 9 (GLOVE) ×1 IMPLANT
GLOVE BIOGEL PI INDICATOR 8 (GLOVE) ×2
GLOVE BIOGEL PI INDICATOR 9 (GLOVE) ×2
GOWN STRL REUS W/ TWL LRG LVL3 (GOWN DISPOSABLE) ×1 IMPLANT
GOWN STRL REUS W/ TWL XL LVL3 (GOWN DISPOSABLE) ×2 IMPLANT
GOWN STRL REUS W/TWL LRG LVL3 (GOWN DISPOSABLE) ×2
GOWN STRL REUS W/TWL XL LVL3 (GOWN DISPOSABLE) ×4
KIT BASIN OR (CUSTOM PROCEDURE TRAY) ×3 IMPLANT
KIT ROOM TURNOVER OR (KITS) ×3 IMPLANT
MANIFOLD NEPTUNE II (INSTRUMENTS) ×3 IMPLANT
NEEDLE HYPO 22GX1.5 SAFETY (NEEDLE) ×6 IMPLANT
NS IRRIG 1000ML POUR BTL (IV SOLUTION) ×3 IMPLANT
PACK TOTAL JOINT (CUSTOM PROCEDURE TRAY) ×3 IMPLANT
PAD ARMBOARD 7.5X6 YLW CONV (MISCELLANEOUS) ×6 IMPLANT
SUT VIC AB 1 CTX 36 (SUTURE) ×2
SUT VIC AB 1 CTX36XBRD ANBCTR (SUTURE) ×1 IMPLANT
SUT VIC AB 2-0 CT1 27 (SUTURE) ×2
SUT VIC AB 2-0 CT1 TAPERPNT 27 (SUTURE) ×1 IMPLANT
SUT VIC AB 3-0 PS2 18 (SUTURE) ×2
SUT VIC AB 3-0 PS2 18XBRD (SUTURE) ×1 IMPLANT
SYR CONTROL 10ML LL (SYRINGE) ×6 IMPLANT
TOWEL OR 17X24 6PK STRL BLUE (TOWEL DISPOSABLE) IMPLANT
TOWEL OR 17X26 10 PK STRL BLUE (TOWEL DISPOSABLE) IMPLANT
TRAY CATH 16FR W/PLASTIC CATH (SET/KITS/TRAYS/PACK) IMPLANT

## 2016-12-02 NOTE — Discharge Instructions (Signed)

## 2016-12-02 NOTE — Evaluation (Signed)
Physical Therapy Evaluation Patient Details Name: Judy Cooper MRN: [id number] DOB: [birth year] Today's Date: 12/02/2016   History of Present Illness  Admitted for LTHA, WBAT;  has a past medical history of Anxiety; Arthritis; Bronchitis; COPD (chronic obstructive pulmonary disease) ([place]); Depression; and TIA (transient ischemic attack).  has a past surgical history that includes Cholecystectomy; Total hip arthroplasty (Right); Total shoulder arthroplasty (Right)  Clinical Impression   Pt is s/p THA resulting in the deficits listed below (see PT Problem List). In a lot of pain on PT eval, but still wanting to get up and OOB; Notable decr coordination with taking steps, likely lingering effect of spinal anesthesia -- anticipate this will be short-lived;  Pt will benefit from skilled PT to increase their independence and safety with mobility to allow discharge to the venue listed below.      Follow Up Recommendations Home health PT;Supervision/Assistance - 24 hour (or per Ortho plan)    Equipment Recommendations  Rolling walker with 5" wheels;3in1 (PT)    Recommendations for Other Services       Precautions / Restrictions Precautions Precautions: Fall Precaution Comments: Unsteadiness likely due to residual effects of spinal; I anticipate this will improve Restrictions Weight Bearing Restrictions: No      Mobility  Bed Mobility Overal bed mobility: Needs Assistance Bed Mobility: Supine to Sit     Supine to sit: Min guard     General bed mobility comments: Cues for technique; used rails  Transfers Overall transfer level: Needs assistance Equipment used: Rolling walker (2 wheeled) Transfers: Sit to/from Stand Sit to Stand: Min assist         General transfer comment: Min assist to steady; cues for hand placement, technqiue, and safety  Ambulation/Gait Ambulation/Gait assistance: Min assist Ambulation Distance (Feet):  (pivot steps bed to chair) Assistive device:  Rolling walker (2 wheeled) Gait Pattern/deviations: Shuffle     General Gait Details: Very uncoordinated steps (once stepping L foot onto R foot) while making her way to chair; min assist for steadiness  Stairs            Wheelchair Mobility    Modified Rankin (Stroke Patients Only)       Balance Overall balance assessment: Needs assistance   Sitting balance-Leahy Scale: Good       Standing balance-Leahy Scale: Poor Standing balance comment: Dependent on UE support                             Pertinent Vitals/Pain Pain Assessment: 0-10 Pain Score: 7  Pain Location: L hip Pain Descriptors / Indicators: Aching Pain Intervention(s): Monitored during session;Patient requesting pain meds-RN notified    Home Living Family/patient expects to be discharged to:: Private residence Living Arrangements: Spouse/significant other Available Help at Discharge: Family;Available 24 hours/day Type of Home: House Home Access: Stairs to enter Entrance Stairs-Rails: Right Entrance Stairs-Number of Steps: 3 Home Layout: One level        Prior Function Level of Independence: Independent               Hand Dominance        Extremity/Trunk Assessment   Upper Extremity Assessment Upper Extremity Assessment: Overall WFL for tasks assessed    Lower Extremity Assessment Lower Extremity Assessment: LLE deficits/detail (uncoordinated stepping, likely from spinal anesthesia) LLE Deficits / Details: Grossly decr AROM and strength, limited by pain LLE: Unable to fully assess due to pain  Communication   Communication: No difficulties  Cognition Arousal/Alertness: Awake/alert Behavior During Therapy: WFL for tasks assessed/performed Overall Cognitive Status: Within Functional Limits for tasks assessed                                        General Comments      Exercises     Assessment/Plan    PT Assessment Patient needs  continued PT services  PT Problem List Decreased strength;Decreased range of motion;Decreased activity tolerance;Decreased balance;Decreased mobility;Decreased coordination;Decreased knowledge of use of DME;Decreased safety awareness;Decreased knowledge of precautions;Pain       PT Treatment Interventions DME instruction;Gait training;Stair training;Functional mobility training;Therapeutic activities;Therapeutic exercise;Patient/family education;Balance training    PT Goals (Current goals can be found in the Care Plan section)  Acute Rehab PT Goals Patient Stated Goal: to chase granddaughter PT Goal Formulation: With patient Time For Goal Achievement: 12/09/16 Potential to Achieve Goals: Good    Frequency 7X/week   Barriers to discharge        Co-evaluation               End of Session Equipment Utilized During Treatment: Gait belt Activity Tolerance: Patient tolerated treatment well Patient left: in chair;with call bell/phone within reach Nurse Communication: Mobility status;Patient requests pain meds PT Visit Diagnosis: Unsteadiness on feet (R26.81);Pain Pain - Right/Left: Left Pain - part of body: Hip    Time: 8832-5498 PT Time Calculation (min) (ACUTE ONLY): 17 min   Charges:   PT Evaluation $PT Eval Low Complexity: 1 Procedure     PT G Codes:        Roney Marion, PT  Acute Rehabilitation Services Pager [phone] Office [phone]   [place] 12/02/2016, 4:51 PM

## 2016-12-02 NOTE — Op Note (Signed)
OPERATIVE REPORT    DATE OF PROCEDURE:  12/02/2016       PREOPERATIVE DIAGNOSIS:  LEFT HIP OSTEOARTHRITIS                                                          POSTOPERATIVE DIAGNOSIS:  LEFT HIP OSTEOARTHRITIS                                                            PROCEDURE: Anterior L  total hip arthroplasty using a 50 mm DePuy Pinnacle  Cup, [employer], 0-degree polyethylene liner, a +4 32 mm ceramic head, a 3 strd Depuy Triloc stem   SURGEON: ZOXWR,UEAVW J    ASSISTANT:   [employer]  (present throughout entire procedure and necessary for timely completion of the procedure)   ANESTHESIA: Spinal BLOOD LOSS: 400cc FLUID REPLACEMENT: 1500 crystalloid Antibiotic: 2gm ancef Tranexamic Acid: 1gm iv, 2gm topical COMPLICATIONS: none    INDICATIONS FOR PROCEDURE: A 66 y.o. year-old With  [place], with protrusioo  for 3 years, x-rays show bone-on-bone arthritic changes, and osteophytes. Despite conservative measures with observation, anti-inflammatory medicine, narcotics, use of a cane, has severe unremitting pain and can ambulate only a few blocks before resting. Patient desires elective L total hip arthroplasty to decrease pain and increase function. The risks, benefits, and alternatives were discussed at length including but not limited to the risks of infection, bleeding, nerve injury, stiffness, blood clots, the need for revision surgery, cardiopulmonary complications, among others, and they were willing to proceed. Questions answered     PROCEDURE IN DETAIL: The patient was identified by armband,  received preoperative IV antibiotics in the holding area at [facility], taken to the operating room , appropriate anesthetic monitors  were attached and  anesthesia was induced with the patienton the gurney. The HANA boots were applied to the feet and he was then transferred to the HANA table with a peroneal post and support underneath the  non-operative le, which was locked in 5 lb traction. Theoperative lower extremity was then prepped and draped in the usual sterile fashion from just above the iliac crest to the knee. And a timeout procedure was performed. We then made a 10 cm incision along the interval at the leading edge of the tensor fascia lata of starting at 2 cm lateral to and 2 cm distal to the ASIS. Small bleeders in the skin and subcutaneous tissue identified and cauterized we dissected down to the fascia and made an incision in the fascia allowing [place] to elevate the fascia of the tensor muscle and exploited the interval between the rectus and the tensor fascia lata. A Hohmann retractor was then placed along the superior neck of the femur and a Cobra retractor along the inferior neck of the femur we teed the capsule starting out at the superior anterior aspect of the acetabulum going distally and made the T along the neck both leaflets of the T were tagged with #2 Ethibond suture. Cobra retractors were then placed along the inferior and superior neck allowing [place] to perform a standard  neck cut and removed the femoral head with a power corkscrew. We then placed a right angle Hohmann retractor along the anterior aspect of the acetabulum a spiked Cobra in the cotyloid notch and posteriorly a Muelller retractor. We then sequentially reamed up to a 49 mm basket reamer obtaining good coverage in all quadrants, verified by C-arm imaging. Under C-arm control with and hammered into place a 50 mm Pinnacle cup in 45 of abduction and 15 of anteversion. The cup seated nicely and required no supplemental screws. We then placed a central hole Eliminator and a 0 polyethylene liner. The foot was then externally rotated to 110, the HANA elevator was placed around the flare of the greater trochanter and the limb was extended and abducted delivering the proximal femur up into the wound. A medium Hohmann retractor was placed over the greater trochanter and a  Mueller retractor along the posterior femoral neck completing the exposure. We then performed releases superiorly and and inferiorly of the capsule going back to the pirformis fossa superiorly and to the lesser trochanter inferiorly. We then entered the proximal femur with the box cutting offset chisel followed by, a canal sounder, the chili pepper and broaching up to a 3 broach. This seated nicely and we reamed the calcar. A trial reduction was performed with a 1 mm 32 mm head.The limb lengths were excellent the hip was stable in 90 of external rotation. At this point the trial components removed and we hammered into place a # 3 Tri-Lock stem with [employer] coating. This was a std offset stem and a + 2 32 mm ceramic ball was then hammered into place the hip was reduced and final C-arm images obtained. The wound was thoroughly irrigated with normal saline solution. We repaired the ant capsule and the tensor fascia lot a with running 0 vicryl suture. the subcutaneous tissue was closed with 2-0 and 3-0 Vicryl suture followed by an Aquacil dressing. At this point the patient was awaken and transferred to hospital gurney without difficulty. The subcutaneous tissue with 0 and 2-0 undyed Vicryl suture and the skin with running  3-0 vicryl subcuticular suture. Aquacil dressing was applied. The patient was then unclamped, rolled supine, awaken extubated and taken to recovery room without difficulty in stable condition.   Kerin Salen 12/02/2016, 11:33 AM

## 2016-12-02 NOTE — Transfer of Care (Signed)
Immediate Anesthesia Transfer of Care Note  Patient: Judy Cooper  Procedure(s) Performed: Procedure(s): TOTAL HIP ARTHROPLASTY ANTERIOR APPROACH (Left)  Patient Location: PACU  Anesthesia Type:MAC and Spinal  Level of Consciousness: awake, alert  and oriented  Airway & Oxygen Therapy: Patient Spontanous Breathing and Patient connected to face mask oxygen  Post-op Assessment: Report given to RN and Post -op Vital signs reviewed and stable  Post vital signs: Reviewed and stable  Last Vitals:  Vitals:   12/02/16 0830  BP: 136/64  Pulse: 68  Resp: 20  Temp: 36.8 C    Last Pain:  Vitals:   12/02/16 0850  TempSrc:   PainSc: 7       Patients Stated Pain Goal: 4 (38/10/17 5102)  Complications: No apparent anesthesia complications

## 2016-12-02 NOTE — Interval H&P Note (Signed)
History and Physical Interval Note:  12/02/2016 9:32 AM  Judy Cooper  has presented today for surgery, with the diagnosis of LEFT HIP OSTEOARTHRITIS  The various methods of treatment have been discussed with the patient and family. After consideration of risks, benefits and other options for treatment, the patient has consented to  Procedure(s): TOTAL HIP ARTHROPLASTY ANTERIOR APPROACH (Left) as a surgical intervention .  The patient's history has been reviewed, patient examined, no change in status, stable for surgery.  I have reviewed the patient's chart and labs.  Questions were answered to the patient's satisfaction.     Kerin Salen

## 2016-12-02 NOTE — Anesthesia Procedure Notes (Signed)
Spinal  Patient location during procedure: OR Start time: 12/02/2016 10:27 AM End time: 12/02/2016 10:32 AM Staffing Anesthesiologist: Roderic Palau Performed: anesthesiologist  Preanesthetic Checklist Completed: patient identified, surgical consent, pre-op evaluation, timeout performed, IV checked, risks and benefits discussed and monitors and equipment checked Spinal Block Patient position: sitting Prep: DuraPrep Patient monitoring: cardiac monitor, continuous pulse ox and blood pressure Approach: midline Location: L3-4 Injection technique: single-shot Needle Needle type: Pencan  Needle gauge: 24 G Needle length: 9 cm Assessment Sensory level: T8 Additional Notes Functioning IV was confirmed and monitors were applied. Sterile prep and drape, including hand hygiene and sterile gloves were used. The patient was positioned and the spine was prepped. The skin was anesthetized with lidocaine.  Free flow of clear CSF was obtained prior to injecting local anesthetic into the CSF.  The spinal needle aspirated freely following injection.  The needle was carefully withdrawn.  The patient tolerated the procedure well.

## 2016-12-03 ENCOUNTER — Inpatient Hospital Stay (HOSPITAL_COMMUNITY): Payer: Medicare HMO

## 2016-12-03 ENCOUNTER — Encounter (HOSPITAL_COMMUNITY): Payer: Self-pay | Admitting: Orthopedic Surgery

## 2016-12-03 LAB — CBC
HEMATOCRIT: 37.1 % (ref 36.0–46.0)
Hemoglobin: 11.8 g/dL — ABNORMAL LOW (ref 12.0–15.0)
MCH: 28.5 pg (ref 26.0–34.0)
MCHC: 31.8 g/dL (ref 30.0–36.0)
MCV: 89.6 fL (ref 78.0–100.0)
PLATELETS: 213 10*3/uL (ref 150–400)
RBC: 4.14 MIL/uL (ref 3.87–5.11)
RDW: 14.7 % (ref 11.5–15.5)
WBC: 9.2 10*3/uL (ref 4.0–10.5)

## 2016-12-03 LAB — BASIC METABOLIC PANEL
Anion gap: 6 (ref 5–15)
BUN: 14 mg/dL (ref 6–20)
CALCIUM: 8.4 mg/dL — AB (ref 8.9–10.3)
CO2: 29 mmol/L (ref 22–32)
CREATININE: 0.8 mg/dL (ref 0.44–1.00)
Chloride: 104 mmol/L (ref 101–111)
GFR calc Af Amer: >60 mL/min (ref >60)
GLUCOSE: 105 mg/dL — AB (ref 65–99)
Potassium: 4 mmol/L (ref 3.5–5.1)
Sodium: 139 mmol/L (ref 135–145)

## 2016-12-03 MED ORDER — TRAMADOL HCL 50 MG PO TABS
50.0000 mg | ORAL_TABLET | Freq: Four times a day (QID) | ORAL | Status: DC | PRN
  Administered 2016-12-03 – 2016-12-04 (×3): 50 mg via ORAL
  Filled 2016-12-03 (×3): qty 1

## 2016-12-03 NOTE — Progress Notes (Signed)
Physical Therapy Treatment Patient Details Name: Judy Cooper MRN: [id number] DOB: [birth year] Today's Date: 12/03/2016    History of Present Illness Admitted for LTHA, WBAT;  has a past medical history of Anxiety; Arthritis; Bronchitis; COPD (chronic obstructive pulmonary disease) ([place]); Depression; and TIA (transient ischemic attack).  has a past surgical history that includes Cholecystectomy; Total hip arthroplasty (Right); Total shoulder arthroplasty (Right)    PT Comments    Pt performed gait and stair training this afternoon.  Deferred therapeutic exercise as patient reports performing exercise in room after lunch.  Will f/u in am before d/c home.     Follow Up Recommendations  Home health PT;Supervision/Assistance - 24 hour (Per ortho plan)     Equipment Recommendations  Rolling walker with 5" wheels;3in1 (PT)    Recommendations for Other Services       Precautions / Restrictions Precautions Precautions: Fall Restrictions Weight Bearing Restrictions: Yes LLE Weight Bearing: Weight bearing as tolerated    Mobility  Bed Mobility Overal bed mobility: Needs Assistance Bed Mobility: Supine to Sit     Supine to sit: Supervision     General bed mobility comments: Good technnique  Transfers Overall transfer level: Needs assistance Equipment used: Rolling walker (2 wheeled) Transfers: Sit to/from Stand Sit to Stand: Min guard;Supervision         General transfer comment: cues for hand placement, technqiue, and safety cues to advance LLE forward.  Performed from bed and toilet.    Ambulation/Gait Ambulation/Gait assistance: Min guard Ambulation Distance (Feet): 140 Feet Assistive device: Rolling walker (2 wheeled) Gait Pattern/deviations: Step-through pattern;Trunk flexed;Decreased stride length   Gait velocity interpretation: Below normal speed for age/gender General Gait Details: Cues for upper trunk control increasing stride length and foot clearance.      Stairs Stairs: Yes   Stair Management: One rail Right;Forwards Number of Stairs: 5 General stair comments: Cues for sequencing and use of rail, required min guard assistance.  Wheelchair Mobility    Modified Rankin (Stroke Patients Only)       Balance Overall balance assessment: Needs assistance Sitting-balance support: Feet supported;No upper extremity supported Sitting balance-Leahy Scale: Good       Standing balance-Leahy Scale: Fair                              Cognition Arousal/Alertness: Awake/alert Behavior During Therapy: WFL for tasks assessed/performed Overall Cognitive Status: Within Functional Limits for tasks assessed                                        Exercises      General Comments        Pertinent Vitals/Pain Pain Assessment: 0-10 Pain Score: 8  Pain Location: L hip Pain Descriptors / Indicators: Aching Pain Intervention(s): Limited activity within patient's tolerance;Repositioned    Home Living                      Prior Function            PT Goals (current goals can now be found in the care plan section) Acute Rehab PT Goals Patient Stated Goal: To play with her granddaughter Potential to Achieve Goals: Good Progress towards PT goals: Progressing toward goals    Frequency    7X/week      PT Plan Current plan remains appropriate  Co-evaluation             End of Session Equipment Utilized During Treatment: Gait belt Activity Tolerance: Patient tolerated treatment well Patient left: in chair;with call bell/phone within reach Nurse Communication: Mobility status;Patient requests pain meds PT Visit Diagnosis: Unsteadiness on feet (R26.81);Pain Pain - Right/Left: Left Pain - part of body: Hip     Time: 8337-4451 PT Time Calculation (min) (ACUTE ONLY): 20 min  Charges:  $Gait Training: 8-22 mins                    G Codes:       Governor Rooks, PTA pager  [phone]    Cristela Blue 12/03/2016, 3:45 PM

## 2016-12-03 NOTE — Progress Notes (Signed)
Physical Therapy Treatment Patient Details Name: Judy Cooper MRN: [id number] DOB: Apr 03, 1951 Today's Date: 12/03/2016    History of Present Illness Admitted for LTHA, WBAT;  has a past medical history of Anxiety; Arthritis; Bronchitis; COPD (chronic obstructive pulmonary disease) ([place]); Depression; and TIA (transient ischemic attack).  has a past surgical history that includes Cholecystectomy; Total hip arthroplasty (Right); Total shoulder arthroplasty (Right)    PT Comments    Pt performed increased mobility during session.  Able to progress gait training and tolerate HEP.  HEP issued and reviewed with patient will follow up this pm and progress as appropriate.  Pt remains to require stair training.      Follow Up Recommendations  Home health PT;Supervision/Assistance - 24 hour (or per Ortho plan.  )     Equipment Recommendations  Rolling walker with 5" wheels;3in1 (PT)    Recommendations for Other Services       Precautions / Restrictions Precautions Precautions: Fall Restrictions Weight Bearing Restrictions: No    Mobility  Bed Mobility               General bed mobility comments: Pt sitting in chair on arrival.    Transfers Overall transfer level: Needs assistance Equipment used: Rolling walker (2 wheeled) Transfers: Sit to/from Stand Sit to Stand: Min guard         General transfer comment: cues for hand placement, technqiue, and safety cues to advance LLE forward.    Ambulation/Gait Ambulation/Gait assistance: Min guard Ambulation Distance (Feet): 70 Feet Assistive device: Rolling walker (2 wheeled) Gait Pattern/deviations: Step-to pattern;Trunk flexed;Decreased stride length;Shuffle   Gait velocity interpretation: Below normal speed for age/gender General Gait Details: Cues for upper trunk control increasing stride length and foot clearance.     Stairs            Wheelchair Mobility    Modified Rankin (Stroke Patients Only)        Balance Overall balance assessment: Needs assistance Sitting-balance support: Feet supported;No upper extremity supported Sitting balance-Leahy Scale: Good Sitting balance - Comments: Donned socks   Standing balance support: No upper extremity supported;During functional activity Standing balance-Leahy Scale: Fair Standing balance comment: maintained balance at sink during grooming task                            Cognition Arousal/Alertness: Awake/alert Behavior During Therapy: WFL for tasks assessed/performed Overall Cognitive Status: Within Functional Limits for tasks assessed                                        Exercises Total Joint Exercises Ankle Circles/Pumps: AROM;Both;10 reps;Supine Quad Sets: AROM;10 reps;Supine;Left Short Arc Quad: AROM;Left;10 reps;Supine Heel Slides: AROM;Left;10 reps;Supine Hip ABduction/ADduction: AROM;Left;20 reps;Supine;Standing Long Arc Quad: AROM;Left;10 reps;Seated Knee Flexion: AROM;Left;10 reps;Standing Marching in Standing: AROM;Left;10 reps;Standing Standing Hip Extension: AROM;Left;10 reps;Standing    General Comments General comments (skin integrity, edema, etc.): She states that she feels she isn't progressing as well as her previous R THA due to increased pain for this LTHA      Pertinent Vitals/Pain Pain Assessment: 0-10 Pain Score: 8  Pain Location: L hip Pain Descriptors / Indicators: Aching Pain Intervention(s): Monitored during session;Repositioned    Home Living Family/patient expects to be discharged to:: Private residence Living Arrangements: Spouse/significant other;Children (Daughter) Available Help at Discharge: Family;Available 24 hours/day Type of Home: House Home Access:  Stairs to enter Entrance Stairs-Rails: Right Home Layout: One level Home Equipment: Walker - 2 wheels;Bedside commode      Prior Function Level of Independence: Independent          PT Goals (current goals  can now be found in the care plan section) Acute Rehab PT Goals Patient Stated Goal: To play with her granddaughter Potential to Achieve Goals: Good Progress towards PT goals: Progressing toward goals    Frequency    7X/week      PT Plan Current plan remains appropriate    Co-evaluation             End of Session Equipment Utilized During Treatment: Gait belt Activity Tolerance: Patient tolerated treatment well Patient left: in chair;with call bell/phone within reach Nurse Communication: Mobility status;Patient requests pain meds PT Visit Diagnosis: Unsteadiness on feet (R26.81);Pain Pain - Right/Left: Left Pain - part of body: Hip     Time: 0177-9390 PT Time Calculation (min) (ACUTE ONLY): 30 min  Charges:  $Gait Training: 8-22 mins $Therapeutic Exercise: 8-22 mins                    G Codes:       Governor Rooks, PTA pager [phone]    Cristela Blue 12/03/2016, 11:45 AM

## 2016-12-03 NOTE — Anesthesia Postprocedure Evaluation (Addendum)
Anesthesia Post Note  Patient: KORAYMA HAGWOOD  Procedure(s) Performed: Procedure(s) (LRB): TOTAL HIP ARTHROPLASTY ANTERIOR APPROACH (Left)  Patient location during evaluation: PACU Anesthesia Type: MAC Level of consciousness: awake and alert Pain management: pain level controlled Vital Signs Assessment: post-procedure vital signs reviewed and stable Respiratory status: spontaneous breathing, nonlabored ventilation, respiratory function stable and patient connected to nasal cannula oxygen Cardiovascular status: stable and blood pressure returned to baseline Postop Assessment: no signs of nausea or vomiting and spinal receding Anesthetic complications: no       Last Vitals:  Vitals:   12/03/16 1518 12/03/16 2017  BP: (!) 142/85 116/71  Pulse: 88 87  Resp: 18 18  Temp: 37.1 C 36.8 C    Last Pain:  Vitals:   12/03/16 2017  TempSrc: Oral  PainSc:                  Kashari Chalmers

## 2016-12-03 NOTE — Evaluation (Signed)
Occupational Therapy Evaluation Patient Details Name: Judy Cooper MRN: [id number] DOB: [birth year] Today's Date: 12/03/2016    History of Present Illness Admitted for LTHA, WBAT;  has a past medical history of Anxiety; Arthritis; Bronchitis; COPD (chronic obstructive pulmonary disease) ([place]); Depression; and TIA (transient ischemic attack).  has a past surgical history that includes Cholecystectomy; Total hip arthroplasty (Right); Total shoulder arthroplasty (Right)   Clinical Impression   PTA, pt was living with her husband and was independent. Currently, pt required Min guard A for ADLs in standing and functional mobility with RW. Pt demonstrated good RW management and hand placement during transfers. Pt required Min A for LB ADLs and confirms that her family will A. Pt would benefit from continued skilled OT to increase pt safety and independence with ADLs and functional mobility. Recommend pt dc home once medically stable per physician.     Follow Up Recommendations  No OT follow up;Supervision/Assistance - 24 hour (Pending pt progress for HHOT)    Equipment Recommendations  None recommended by OT    Recommendations for Other Services       Precautions / Restrictions Precautions Precautions: Fall Restrictions Weight Bearing Restrictions: No      Mobility Bed Mobility               General bed mobility comments: At EOB upon arrival  Transfers Overall transfer level: Needs assistance Equipment used: Rolling walker (2 wheeled) Transfers: Sit to/from Stand Sit to Stand: Min guard         General transfer comment: cues for hand placement, technqiue, and safety    Balance Overall balance assessment: Needs assistance Sitting-balance support: Feet supported;No upper extremity supported Sitting balance-Leahy Scale: Good Sitting balance - Comments: Donned socks   Standing balance support: No upper extremity supported;During functional activity Standing  balance-Leahy Scale: Fair Standing balance comment: maintained balance at sink during grooming task                           ADL either performed or assessed with clinical judgement   ADL Overall ADL's : Needs assistance/impaired Eating/Feeding: Set up;Sitting   Grooming: Oral care;Min guard;Standing   Upper Body Bathing: Set up;Supervision/ safety;Sitting   Lower Body Bathing: Min guard;Sit to/from stand   Upper Body Dressing : Sitting;Set up   Lower Body Dressing: Minimal assistance;Sit to/from stand Lower Body Dressing Details (indicate cue type and reason): Pt reports that she donned pants prior to OT session and used good technique. For donning socks, pt required Min A for LLE due to increase pain and limited ROM. Pt states that her family will help her at home Toilet Transfer: Min guard;RW;Ambulation (Simulated with recliner)         Tub/Shower Transfer Details (indicate cue type and reason): Benefit from education and training on walk-in shower transfer with 3n1 Functional mobility during ADLs: Min guard;Rolling walker General ADL Comments: Pt demonstrates good progress. Feel she will continue to progress well and would benefit from further transfer training     Vision Baseline Vision/History: Wears glasses Wears Glasses: At all times Patient Visual Report: No change from baseline       Perception     Praxis      Pertinent Vitals/Pain Pain Assessment: 0-10 Pain Score: 7  Pain Location: L hip Pain Descriptors / Indicators: Aching Pain Intervention(s): Monitored during session     Hand Dominance Right   Extremity/Trunk Assessment Upper Extremity Assessment Upper Extremity Assessment: Overall [facility]  for tasks assessed   Lower Extremity Assessment Lower Extremity Assessment: Defer to PT evaluation;LLE deficits/detail LLE Deficits / Details: Grossly decr AROM and strength, limited by pain LLE: Unable to fully assess due to pain   Cervical / Trunk  Assessment Cervical / Trunk Assessment: Normal   Communication Communication Communication: No difficulties   Cognition Arousal/Alertness: Awake/alert Behavior During Therapy: WFL for tasks assessed/performed Overall Cognitive Status: Within Functional Limits for tasks assessed                                     General Comments  She states that she feels she isn't progressing as well as her previous R THA due to increased pain for this LTHA    Exercises     Shoulder Instructions      Home Living Family/patient expects to be discharged to:: Private residence Living Arrangements: Spouse/significant other;Children (Daughter) Available Help at Discharge: Family;Available 24 hours/day Type of Home: House Home Access: Stairs to enter [employer] of Steps: 3 Entrance Stairs-Rails: Right Home Layout: One level     Bathroom Shower/Tub: Occupational psychologist: Standard Bathroom Accessibility: Yes How Accessible: Accessible via walker Home Equipment: [place] - 2 wheels;Bedside commode          Prior Functioning/Environment Level of Independence: Independent                 OT Problem List: Decreased strength;Decreased range of motion;Decreased activity tolerance;Impaired balance (sitting and/or standing);Decreased knowledge of use of DME or AE;Pain;Decreased knowledge of precautions      OT Treatment/Interventions: Self-care/ADL training;Therapeutic exercise;Energy conservation;DME and/or AE instruction;Therapeutic activities;Patient/family education    OT Goals(Current goals can be found in the care plan section) Acute Rehab OT Goals Patient Stated Goal: To play with her granddaughter OT Goal Formulation: With patient Time For Goal Achievement: 12/17/16 Potential to Achieve Goals: Good ADL Goals Pt Will Transfer to Toilet: with supervision;bedside commode;ambulating Pt Will Perform Toileting - Clothing Manipulation and hygiene:  with supervision;sit to/from stand Pt Will Perform Tub/Shower Transfer: with min guard assist;Shower transfer;3 in 1;rolling walker;ambulating  OT Frequency: Min 2X/week   Barriers to D/C:            Co-evaluation              End of Session Equipment Utilized During Treatment: Gait belt;Rolling walker Nurse Communication: Mobility status; Pain medication concern  Activity Tolerance: Patient tolerated treatment well Patient left: in chair;with call bell/phone within reach  OT Visit Diagnosis: Unsteadiness on feet (R26.81);Muscle weakness (generalized) (M62.81);Pain Pain - Right/Left: Left Pain - part of body: Hip;Leg                Time: 6553-7482 OT Time Calculation (min): 19 min Charges:  OT General Charges $OT Visit: 1 Procedure OT Evaluation $OT Eval Low Complexity: 1 Procedure G-Codes:     Kemiya Batdorf, OTR/L [phone]  [place] 12/03/2016, 10:17 AM

## 2016-12-03 NOTE — Progress Notes (Signed)
PATIENT ID: Judy Cooper  MRN: [id number]  DOB/AGE:  18-Apr-1951 / 66 y.o.  1 Day Post-Op Procedure(s) (LRB): TOTAL HIP ARTHROPLASTY ANTERIOR APPROACH (Left)    PROGRESS NOTE Subjective: Patient is alert, oriented, no Nausea, no Vomiting, yes passing gas, . Taking PO welll. Denies SOB, Chest or Calf Pain. Using Incentive Spirometer, PAS in place. Ambulate in room Patient reports pain as  4/10. Patient reports pain in the middle of the left thigh, since she left the recovery room yesterday.  She has ambulated to the bathroom and back and reports that this is the most painful area.  She does not report any swelling or bruising in this area.  Objective: Vital signs in last 24 hours: Vitals:   12/02/16 1500 12/02/16 2052 12/03/16 0022 12/03/16 0555  BP: (!) 147/69 (!) 149/75 (!) 91/52 116/73  Pulse:  83 74 67  Resp: 16 18 18 18   Temp: 97.8 F (36.6 C) 98.2 F (36.8 C) 98.2 F (36.8 C) 98.2 F (36.8 C)  TempSrc: Oral Oral Oral Oral  SpO2: 94% 98% 94% 95%      Intake/Output from previous day: I/O last 3 completed shifts: In: 1760 [P.O.:360; I.V.:1400] Out: 802 [Urine:402; Emesis/NG output:50; Blood:350]   Intake/Output this shift: No intake/output data recorded.   LABORATORY DATA:  Recent Labs  12/03/16 0450  WBC 9.2  HGB 11.8*  HCT 37.1  PLT 213  NA 139  K 4.0  CL 104  CO2 29  BUN 14  CREATININE 0.80  GLUCOSE 105*  CALCIUM 8.4*    Examination: Neurologically intact ABD soft Neurovascular intact Sensation intact distally Intact pulses distally Dorsiflexion/Plantar flexion intact Incision: no drainage No cellulitis present Compartment soft} The patient is tender to palpation at the junction of the middle and distal thirds of the left thigh.  There is no objective swelling or erythema.  Minimal discomfort with foot tap.  Internal and external rotation does not cause discomfort.  Varus and valgus stress using the knee as a fulcrum and the foot as a lever does  not cause pain XR AP&Lat of hip shows well placed\fixed THA  Assessment:   1 Day Post-Op Procedure(s) (LRB): TOTAL HIP ARTHROPLASTY ANTERIOR APPROACH (Left) ADDITIONAL DIAGNOSIS:  Expected Acute Blood Loss Anemia, Hypertension, COPD, coronary artery disease, hypothyroidism, elevated A1c.  Plan: PT/OT WBAT, THA We will order 2 views of the left femur, patient is complaining it pain at the junction of the middle and distal thirds of the thigh DVT Prophylaxis: SCDx72 hrs, ASA 325 mg BID x 2 weeks  DISCHARGE PLAN: Home,Hopefully, when she passes physical therapy tomorrow  DISCHARGE NEEDS: HHPT, Walker and 3-in-1 comode seatPatient ID: Marene Lenz, female   DOB: 11/06/1950, 66 y.o.   MRN: [id number]

## 2016-12-04 LAB — CBC
HCT: 35.7 % — ABNORMAL LOW (ref 36.0–46.0)
HEMOGLOBIN: 11.6 g/dL — AB (ref 12.0–15.0)
MCH: 29.1 pg (ref 26.0–34.0)
MCHC: 32.5 g/dL (ref 30.0–36.0)
MCV: 89.7 fL (ref 78.0–100.0)
PLATELETS: 214 10*3/uL (ref 150–400)
RBC: 3.98 MIL/uL (ref 3.87–5.11)
RDW: 14.9 % (ref 11.5–15.5)
WBC: 11.9 10*3/uL — ABNORMAL HIGH (ref 4.0–10.5)

## 2016-12-04 NOTE — Progress Notes (Signed)
Occupational Therapy Treatment Patient Details Name: Judy Cooper MRN: [id number] DOB: 06-30-1951 Today's Date: 12/04/2016    History of present illness Admitted for [place];  has a past medical history of Anxiety; Arthritis; Bronchitis; COPD (chronic obstructive pulmonary disease) ([place]); Depression; and TIA (transient ischemic attack).  has a past surgical history that includes Cholecystectomy; Total hip arthroplasty (Right); Total shoulder arthroplasty (Right)   OT comments  Pt progressing towards goals. Educated pt on walk-in shower transfer; pt demonstrated good understanding and performed transfer with supervision and RW. All acute OT needs met and questions answered. Recommend dc home once medically stable per physician. Will sign off. Thank you.   Follow Up Recommendations  No OT follow up;Supervision/Assistance - 24 hour    Equipment Recommendations  None recommended by OT    Recommendations for Other Services      Precautions / Restrictions Precautions Precautions: Fall Precaution Comments: Unsteadiness likely due to residual effects of spinal; I anticipate this will improve Restrictions Weight Bearing Restrictions: Yes LLE Weight Bearing: Weight bearing as tolerated       Mobility Bed Mobility Overal bed mobility: Needs Assistance Bed Mobility: Supine to Sit;Sit to Supine     Supine to sit: Supervision Sit to supine: Min assist (One hand A due to severe pain)   General bed mobility comments: Good technnique  Transfers Overall transfer level: Needs assistance Equipment used: Rolling walker (2 wheeled) Transfers: Sit to/from Stand Sit to Stand: Supervision         General transfer comment: Demonstrated good technique    Balance Overall balance assessment: Needs assistance Sitting-balance support: Feet supported;No upper extremity supported Sitting balance-Leahy Scale: Good Sitting balance - Comments: Donned socks   Standing balance support: No  upper extremity supported;During functional activity Standing balance-Leahy Scale: Fair Standing balance comment: Good balance durign walk-in shower transfer                           ADL either performed or assessed with clinical judgement   ADL Overall ADL's : Needs assistance/impaired                   Upper Body Dressing Details (indicate cue type and reason): Pt dressed upon arrival Lower Body Dressing: Min guard;Sit to/from stand Lower Body Dressing Details (indicate cue type and reason): Pt donned socks with Min gaurd but was in severe pain.         Tub/ Shower Transfer: Walk-in shower;Supervision/safety;Set up;3 in 1;Rolling walker;Ambulation Tub/Shower Transfer Details (indicate cue type and reason): Pt demonstrated understanding of safety transfer techniques Functional mobility during ADLs: Min guard;Rolling walker General ADL Comments: Pt progressign well. She reports worse pain today. However, is demonstrating good funcitonal progress     Vision       Perception     Praxis      Cognition Arousal/Alertness: Awake/alert Behavior During Therapy: WFL for tasks assessed/performed Overall Cognitive Status: Within Functional Limits for tasks assessed                                          Exercises     Shoulder Instructions       General Comments      Pertinent Vitals/ Pain       Pain Assessment: 0-10 Pain Score: 10-Worst pain ever Pain Location: L hip Pain Descriptors / Indicators: Aching;Constant;Grimacing (  Tearful) Pain Intervention(s): Monitored during session  Home Living                                          Prior Functioning/Environment              Frequency  Min 2X/week        Progress Toward Goals  OT Goals(current goals can now be found in the care plan section)  Progress towards OT goals: Progressing toward goals  Acute Rehab OT Goals Patient Stated Goal: To play with  her granddaughter OT Goal Formulation: With patient Time For Goal Achievement: 12/17/16 Potential to Achieve Goals: Good ADL Goals Pt Will Transfer to Toilet: with supervision;bedside commode;ambulating Pt Will Perform Toileting - Clothing Manipulation and hygiene: with supervision;sit to/from stand Pt Will Perform Tub/Shower Transfer: with min guard assist;Shower transfer;3 in 1;rolling walker;ambulating  Plan Discharge plan remains appropriate;All goals met and education completed, patient discharged from OT services    Co-evaluation                 End of Session Equipment Utilized During Treatment: Gait belt;Rolling walker  OT Visit Diagnosis: Unsteadiness on feet (R26.81);Muscle weakness (generalized) (M62.81);Pain Pain - Right/Left: Left Pain - part of body: Hip;Leg   Activity Tolerance Patient tolerated treatment well   Patient Left in chair;with call bell/phone within reach   Nurse Communication Mobility status        Time: 2162-4469 OT Time Calculation (min): 13 min  Charges: OT General Charges $OT Visit: 1 Procedure OT Treatments $Self Care/Home Management : 8-22 mins  [place], OTR/L [place] 12/04/2016, 10:10 AM

## 2016-12-04 NOTE — Progress Notes (Signed)
Physical Therapy Treatment Patient Details Name: Judy Cooper MRN: [id number] DOB: [birth year] Today's Date: 12/04/2016    History of Present Illness Admitted for LTHA, WBAT;  has a past medical history of Anxiety; Arthritis; Bronchitis; COPD (chronic obstructive pulmonary disease) ([place]); Depression; and TIA (transient ischemic attack).  has a past surgical history that includes Cholecystectomy; Total hip arthroplasty (Right); Total shoulder arthroplasty (Right)    PT Comments    Pt performed increased mobility and reviewed stair training and exercises.  Pt is ready to d/c home at this time.     Follow Up Recommendations  Home health PT;Supervision/Assistance - 24 hour (Per ortho plan)     Equipment Recommendations  Rolling walker with 5" wheels;3in1 (PT)    Recommendations for Other Services       Precautions / Restrictions Precautions Precautions: Fall Precaution Comments: Unsteadiness likely due to residual effects of spinal; I anticipate this will improve Restrictions Weight Bearing Restrictions: Yes LLE Weight Bearing: Weight bearing as tolerated    Mobility  Bed Mobility Overal bed mobility: Needs Assistance Bed Mobility: Supine to Sit;Sit to Supine     Supine to sit: Supervision Sit to supine: Min assist (One hand A due to severe pain)   General bed mobility comments: Pt standing in room on arrival.    Transfers Overall transfer level: Modified independent Equipment used: Rolling walker (2 wheeled) Transfers: Sit to/from Stand Sit to Stand: Modified independent (Device/Increase time)         General transfer comment: Pt performed with good technique.    Ambulation/Gait Ambulation/Gait assistance: Supervision Ambulation Distance (Feet): 200 Feet Assistive device: Rolling walker (2 wheeled) Gait Pattern/deviations: Step-through pattern;Trunk flexed;Decreased stride length   Gait velocity interpretation: Below normal speed for age/gender General Gait  Details: Cues for upper trunk control increasing stride length and foot clearance.     Stairs Stairs: Yes   Stair Management: One rail Right;Forwards Number of Stairs: 5 General stair comments: Good technique and able to recall sequencing.    Wheelchair Mobility    Modified Rankin (Stroke Patients Only)       Balance Overall balance assessment: Needs assistance Sitting-balance support: Feet supported;No upper extremity supported Sitting balance-Leahy Scale: Good Sitting balance - Comments: Donned socks   Standing balance support: No upper extremity supported;During functional activity Standing balance-Leahy Scale: Good Standing balance comment: Good balance durign walk-in shower transfer                            Cognition Arousal/Alertness: Awake/alert Behavior During Therapy: WFL for tasks assessed/performed Overall Cognitive Status: Within Functional Limits for tasks assessed                                        Exercises Total Joint Exercises Ankle Circles/Pumps: AROM;Both;10 reps;Supine Short Arc Quad: AROM;Left;10 reps;Supine Heel Slides: AROM;Left;10 reps;Supine Hip ABduction/ADduction: AROM;Left;20 reps;Supine;Standing (1x10 in standing and 1x10 in supine) Long Arc Quad: AROM;Left;10 reps;Seated Knee Flexion: AROM;Left;10 reps;Standing Marching in Standing: AROM;Left;10 reps;Standing Standing Hip Extension: AROM;Left;10 reps;Standing    General Comments        Pertinent Vitals/Pain Pain Assessment: 0-10 Pain Score: 10-Worst pain ever Pain Location: L hip Pain Descriptors / Indicators: Grimacing;Guarding Pain Intervention(s): Monitored during session;Repositioned    Home Living  Prior Function            PT Goals (current goals can now be found in the care plan section) Acute Rehab PT Goals Patient Stated Goal: To play with her granddaughter Potential to Achieve Goals: Good Progress  towards PT goals: Progressing toward goals    Frequency    7X/week      PT Plan Current plan remains appropriate    Co-evaluation             End of Session Equipment Utilized During Treatment: Gait belt Activity Tolerance: Patient tolerated treatment well Patient left: in chair;with call bell/phone within reach Nurse Communication: Mobility status;Patient requests pain meds PT Visit Diagnosis: Unsteadiness on feet (R26.81);Pain Pain - Right/Left: Left Pain - part of body: Hip     Time: 2725-3664 PT Time Calculation (min) (ACUTE ONLY): 23 min  Charges:  $Gait Training: 8-22 mins $Therapeutic Exercise: 8-22 mins                    G Codes:       Governor Rook[place], PTA pager [phone]    Cristela Blue 12/04/2016, 11:54 AM

## 2016-12-04 NOTE — Progress Notes (Signed)
Patient to be discharge, instructions and prescriptions reviewed with patient. Patient stated understanding. IV removed. Patient waiting on transportation

## 2016-12-04 NOTE — Discharge Summary (Signed)
Patient ID: [name] MRN: [id number] DOB/AGE: May 09, 1951 66 y.o.  Admit date: 12/02/2016 Discharge date: 12/04/2016  Admission Diagnoses:  Principal Problem:   Primary osteoarthritis of left hip Active Problems:   Primary localized osteoarthritis of left hip   Discharge Diagnoses:  Same  Past Medical History:  Diagnosis Date  . Anxiety   . Arthritis   . Bronchitis    hx  . COPD (chronic obstructive pulmonary disease) ([place])   . Depression   . 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)  . Dyspnea    occasionally  . GERD (gastroesophageal reflux disease)   . Hypertension   . Hypothyroidism   . Pneumonia    hx  . TIA (transient ischemic attack)     Surgeries: Procedure(s): TOTAL HIP ARTHROPLASTY ANTERIOR APPROACH on 12/02/2016   Consultants:   Discharged Condition: Improved  Hospital Course: Judy Cooper is an 66 y.o. female who was admitted 12/02/2016 for operative treatment ofPrimary osteoarthritis of left hip. Patient has severe unremitting pain that affects sleep, daily activities, and work/hobbies. After pre-op clearance the patient was taken to the operating room on 12/02/2016 and underwent  Procedure(s): TOTAL HIP ARTHROPLASTY ANTERIOR APPROACH.    Patient was given perioperative antibiotics: Anti-infectives    Start     Dose/Rate Route Frequency Ordered Stop   12/02/16 0721  ceFAZolin (ANCEF) IVPB 2g/100 mL premix     2 g 200 mL/hr over 30 Minutes Intravenous On call to O.R. 12/02/16 0721 12/02/16 1024       Patient was given sequential compression devices, early ambulation, and chemoprophylaxis to prevent DVT.  Patient benefited maximally from hospital stay and there were no complications.    Recent vital signs: Patient Vitals for the past 24 hrs:  BP Temp Temp src Pulse Resp SpO2  12/03/16 2017 116/71 98.2 F (36.8 C) Oral 87 18 98 %  12/03/16 1518 (!) 142/85 98.7 F (37.1 C) Oral 88 18  97 %     Recent laboratory studies:  Recent Labs  12/03/16 0450  WBC 9.2  HGB 11.8*  HCT 37.1  PLT 213  NA 139  K 4.0  CL 104  CO2 29  BUN 14  CREATININE 0.80  GLUCOSE 105*  CALCIUM 8.4*     Discharge Medications:   Allergies as of 12/04/2016      Reactions   Codeine    Other reaction(s): Other (See Comments) dizzy      Medication List    STOP taking these medications   HYDROcodone-acetaminophen 10-325 MG tablet Commonly known as:  NORCO   naproxen sodium 220 MG tablet Commonly known as:  ANAPROX     TAKE these medications   albuterol 108 (90 Base) MCG/ACT inhaler Commonly known as:  PROVENTIL HFA;VENTOLIN HFA Inhale 2 puffs into the lungs every 6 (six) hours as needed for wheezing or shortness of breath.   aspirin EC 325 MG tablet Take 1 tablet (325 mg total) by mouth 2 (two) times daily.   atorvastatin 20 MG tablet Commonly known as:  LIPITOR Take 20 mg by mouth at bedtime.   diclofenac sodium 1 % Gel Commonly known as:  VOLTAREN Apply 1 g topically 4 (four) times daily as needed for pain.   DULoxetine 30 MG capsule Commonly known as:  CYMBALTA Take 30 mg by mouth daily.   esomeprazole 40 MG capsule Commonly known as:  NEXIUM Take 40 mg by mouth daily.  levothyroxine 25 MCG tablet Commonly known as:  SYNTHROID, LEVOTHROID Take 25 mcg by mouth daily before breakfast.   losartan-hydrochlorothiazide 50-12.5 MG tablet Commonly known as:  HYZAAR Take 1 tablet by mouth daily. for blood pressure   oxyCODONE-acetaminophen 5-325 MG tablet Commonly known as:  ROXICET Take 1-2 tablets by mouth every 4 (four) hours as needed.   tiZANidine 2 MG tablet Commonly known as:  ZANAFLEX Take 1 tablet (2 mg total) by mouth every 6 (six) hours as needed for muscle spasms.   traZODone 100 MG tablet Commonly known as:  DESYREL Take 100 mg by mouth at bedtime as needed for sleep.            Durable Medical Equipment        Start     Ordered    12/02/16 1503  DME Walker rolling  Once    Question:  Patient needs a walker to treat with the following condition  Answer:  Primary localized osteoarthritis of left hip   12/02/16 1502   12/02/16 1503  DME 3 n 1  Once     12/02/16 1502   12/02/16 1503  DME Bedside commode  Once    Question:  Patient needs a bedside commode to treat with the following condition  Answer:  Primary localized osteoarthritis of left hip   12/02/16 1502      Diagnostic Studies: Dg Chest 2 View  Result Date: 11/27/2016 CLINICAL DATA:  Preoperative examination for hip surgery on Monday. History of bronchitis, COPD, dyspnea, hypertension, hypothyroidism, pneumonia. EXAM: CHEST  2 VIEW COMPARISON:  CT chest 11/25/2016.  Chest radiograph 07/24/2015 FINDINGS: Normal heart size and pulmonary vascularity. No focal airspace disease or consolidation in the lungs. No blunting of costophrenic angles. No pneumothorax. Mediastinal contours appear intact. Prominent apical cardiac fat pad on the left. Postoperative changes in the right shoulder. Degenerative changes in the left shoulder and spine. Surgical clips in the right upper quadrant. IMPRESSION: No active cardiopulmonary disease. Electronically Signed   By: Lucienne Capers M.D.   On: 11/27/2016 02:12   Dg C-arm 1-60 Min  Result Date: 12/02/2016 CLINICAL DATA:  Status post total hip replacement EXAM: OPERATIVE LEFT HIP; DG C-ARM 61-120 MIN COMPARISON:  None. FLUOROSCOPY TIME:  0 minutes 21 seconds; 2 acquired images FINDINGS: Frontal view obtained. There are total hip replacements bilaterally. The right total hip replacement is incompletely visualized. The left total hip replacement prosthetic components appear well seated on frontal view. No fracture or dislocation. IMPRESSION: Total hip prosthetic components on the left appear well seated. No fracture or dislocation. Electronically Signed   By: Lowella Grip III M.D.   On: 12/02/2016 11:55   Dg Hip Operative Unilat W Or  W/o Pelvis Left  Result Date: 12/02/2016 CLINICAL DATA:  Status post total hip replacement EXAM: OPERATIVE LEFT HIP; DG C-ARM 61-120 MIN COMPARISON:  None. FLUOROSCOPY TIME:  0 minutes 21 seconds; 2 acquired images FINDINGS: Frontal view obtained. There are total hip replacements bilaterally. The right total hip replacement is incompletely visualized. The left total hip replacement prosthetic components appear well seated on frontal view. No fracture or dislocation. IMPRESSION: Total hip prosthetic components on the left appear well seated. No fracture or dislocation. Electronically Signed   By: Lowella Grip III M.D.   On: 12/02/2016 11:55   Dg Femur Min 2 Views Left  Result Date: 12/03/2016 CLINICAL DATA:  Constant pain in the distal femur. Hip arthroplasty yesterday. EXAM: LEFT FEMUR 2 VIEWS COMPARISON:  Fluoroscopy  from yesterday FINDINGS: Total hip arthroplasty with expected soft tissue gas and swelling. No dislocation or periprosthetic fracture noted. No findings in the distal femur to explain acute pain. There is degenerative marginal spurring at the knee. Negative for joint effusion. Osteopenia. IMPRESSION: No acute or unexpected finding. Electronically Signed   By: Monte Fantasia M.D.   On: 12/03/2016 08:42    Disposition: Final discharge disposition not confirmed  Discharge Instructions    Call MD / Call 911    Complete by:  As directed    If you experience chest pain or shortness of breath, CALL 911 and be transported to the hospital emergency room.  If you develope a fever above 101 F, pus (white drainage) or increased drainage or redness at the wound, or calf pain, call your surgeon's office.   Constipation Prevention    Complete by:  As directed    Drink plenty of fluids.  Prune juice may be helpful.  You may use a stool softener, such as Colace (over the counter) 100 mg twice a day.  Use MiraLax (over the counter) for constipation as needed.   Diet - low sodium heart healthy     Complete by:  As directed    Driving restrictions    Complete by:  As directed    No driving for 2 weeks   Follow the hip precautions as taught in Physical Therapy    Complete by:  As directed    Increase activity slowly as tolerated    Complete by:  As directed    Patient may shower    Complete by:  As directed    You may shower without a dressing once there is no drainage.  Do not wash over the wound.  If drainage remains, cover wound with plastic wrap and then shower.      Follow-up Information    Kerin Salen, MD Follow up in 2 week(s).   Specialty:  Orthopedic Surgery Contact information: [place] [address] [phone]            Signed: Theodosia Quay 12/04/2016, 7:52 AM

## 2016-12-04 NOTE — Progress Notes (Signed)
PATIENT ID: Judy Cooper  MRN: [id number]  DOB/AGE:  [birth year] 66 y.o.  2 Days Post-Op Procedure(s) (LRB): TOTAL HIP ARTHROPLASTY ANTERIOR APPROACH (Left)    PROGRESS NOTE Subjective: Patient is alert, oriented, no Nausea, no Vomiting, yes passing gas, . Taking PO well with pt up and eating at bed side. Denies SOB, Chest or Calf Pain. Using Incentive Spirometer, PAS in place. Ambulate WBAT with pt walking 140 ft with therapy Patient reports pain as  6/10  .    Objective: Vital signs in last 24 hours: Vitals:   12/03/16 0022 12/03/16 0555 12/03/16 1518 12/03/16 2017  BP: (!) 91/52 116/73 (!) 142/85 116/71  Pulse: 74 67 88 87  Resp: 18 18 18 18   Temp: 98.2 F (36.8 C) 98.2 F (36.8 C) 98.7 F (37.1 C) 98.2 F (36.8 C)  TempSrc: Oral Oral Oral Oral  SpO2: 94% 95% 97% 98%      Intake/Output from previous day: I/O last 3 completed shifts: In: 720 [P.O.:720] Out: 2 [Urine:2]   Intake/Output this shift: No intake/output data recorded.   LABORATORY DATA:  Recent Labs  12/03/16 0450  WBC 9.2  HGB 11.8*  HCT 37.1  PLT 213  NA 139  K 4.0  CL 104  CO2 29  BUN 14  CREATININE 0.80  GLUCOSE 105*  CALCIUM 8.4*    Examination: Neurologically intact Neurovascular intact Sensation intact distally Intact pulses distally Dorsiflexion/Plantar flexion intact Incision: dressing C/D/I No cellulitis present Compartment soft} XR AP&Lat of hip shows well placed\fixed THA  Assessment:   2 Days Post-Op Procedure(s) (LRB): TOTAL HIP ARTHROPLASTY ANTERIOR APPROACH (Left) ADDITIONAL DIAGNOSIS:  Expected Acute Blood Loss Anemia, Hypertension, COPD, coronary artery disease, hypothyroidism, elevated A1c.  Plan: PT/OT WBAT, THA  DVT Prophylaxis: SCDx72 hrs, ASA 325 mg BID x 2 weeks  DISCHARGE PLAN: Home  DISCHARGE NEEDS: HHPT, Walker and 3-in-1 comode seat

## 2016-12-04 NOTE — Care Management Note (Signed)
Case Management Note  Patient Details  Name: AIDAH FORQUER MRN: [id number] Date of Birth: March 14, 1951  Subjective/Objective:                 Spoke with patient at the bedside. She is wanting to use [facility] for [facility] services, referral made to [facility]. Patient has DME in room.    Action/Plan:  DC to home with [facility] through [facility].  Expected Discharge Date:  12/04/16               Expected Discharge Plan:  [place]  In-House Referral:     Discharge planning Services  CM Consult  Post Acute Care Choice:  Durable Medical Equipment, Home Health Choice offered to:     DME Arranged:    DME Agency:  [place] Arranged:  PT, OT, Social Work [employer] Agency:     Status of Service:  Completed, signed off  If discussed at [employer], dates discussed:    Additional Comments:  Carles Collet, RN 12/04/2016, 11:33 AM

## 2016-12-04 NOTE — Progress Notes (Addendum)
Patient declined by [place], [facility], [place], and [facility]. Pending approval for [facility] PT by [facility] or [facility].

## 2016-12-06 NOTE — Care Management (Signed)
Case manager received call from patient stating no one has contacted her concerning [place]. CM contacted [facility] to see if patient was assigned and was informed that they declined patient. CM had no knowledge of this. Referral was called to [facility] [place], [place] and she accepted patient. CM will notify patient that she will be contacted by them for [facility] therapy.

## 2017-01-13 ENCOUNTER — Encounter (HOSPITAL_COMMUNITY): Payer: Self-pay | Admitting: Orthopedic Surgery

## 2017-01-13 NOTE — Addendum Note (Signed)
Addendum  created 01/13/17 1418 by Oleta Mouse, MD   Sign clinical note

## 2017-01-16 DIAGNOSIS — M47816 Spondylosis without myelopathy or radiculopathy, lumbar region: Secondary | ICD-10-CM | POA: Insufficient documentation

## 2017-01-16 HISTORY — DX: Spondylosis without myelopathy or radiculopathy, lumbar region: M47.816

## 2017-06-06 DIAGNOSIS — J988 Other specified respiratory disorders: Secondary | ICD-10-CM

## 2017-06-06 HISTORY — DX: Other specified respiratory disorders: J98.8

## 2017-08-12 DIAGNOSIS — N189 Chronic kidney disease, unspecified: Secondary | ICD-10-CM | POA: Insufficient documentation

## 2017-08-12 HISTORY — DX: Chronic kidney disease, unspecified: N18.9

## 2017-08-14 DIAGNOSIS — M25561 Pain in right knee: Secondary | ICD-10-CM

## 2017-08-14 DIAGNOSIS — G8929 Other chronic pain: Secondary | ICD-10-CM

## 2017-08-14 HISTORY — DX: Pain in right knee: M25.561

## 2017-08-14 HISTORY — DX: Other chronic pain: G89.29

## 2017-08-20 DIAGNOSIS — R7303 Prediabetes: Secondary | ICD-10-CM | POA: Insufficient documentation

## 2017-08-20 DIAGNOSIS — E7800 Pure hypercholesterolemia, unspecified: Secondary | ICD-10-CM

## 2017-08-20 HISTORY — DX: Prediabetes: R73.03

## 2017-08-20 HISTORY — DX: Pure hypercholesterolemia, unspecified: E78.00

## 2017-10-24 DIAGNOSIS — I7121 Aneurysm of the ascending aorta, without rupture: Secondary | ICD-10-CM

## 2017-10-24 DIAGNOSIS — I712 Thoracic aortic aneurysm, without rupture: Secondary | ICD-10-CM | POA: Insufficient documentation

## 2017-10-24 HISTORY — DX: Thoracic aortic aneurysm, without rupture: I71.2

## 2017-10-24 HISTORY — DX: Aneurysm of the ascending aorta, without rupture: I71.21

## 2017-12-12 DIAGNOSIS — R1013 Epigastric pain: Secondary | ICD-10-CM | POA: Insufficient documentation

## 2017-12-12 DIAGNOSIS — K5900 Constipation, unspecified: Secondary | ICD-10-CM | POA: Insufficient documentation

## 2017-12-12 DIAGNOSIS — K439 Ventral hernia without obstruction or gangrene: Secondary | ICD-10-CM | POA: Insufficient documentation

## 2017-12-12 HISTORY — DX: Constipation, unspecified: K59.00

## 2017-12-12 HISTORY — DX: Epigastric pain: R10.13

## 2017-12-12 HISTORY — DX: Ventral hernia without obstruction or gangrene: K43.9

## 2018-01-14 DIAGNOSIS — R413 Other amnesia: Secondary | ICD-10-CM

## 2018-01-14 DIAGNOSIS — D3501 Benign neoplasm of right adrenal gland: Secondary | ICD-10-CM

## 2018-01-14 HISTORY — DX: Benign neoplasm of right adrenal gland: D35.01

## 2018-01-14 HISTORY — DX: Other amnesia: R41.3

## 2018-02-04 ENCOUNTER — Encounter: Payer: Self-pay | Admitting: Gastroenterology

## 2018-03-09 ENCOUNTER — Encounter: Payer: Self-pay | Admitting: Gastroenterology

## 2018-03-10 ENCOUNTER — Ambulatory Visit (INDEPENDENT_AMBULATORY_CARE_PROVIDER_SITE_OTHER): Payer: Medicare HMO | Admitting: Gastroenterology

## 2018-03-10 ENCOUNTER — Encounter: Payer: Self-pay | Admitting: Gastroenterology

## 2018-03-10 ENCOUNTER — Telehealth: Payer: Self-pay

## 2018-03-10 VITALS — BP 104/74 | HR 74 | Ht 63.0 in | Wt 172.5 lb

## 2018-03-10 DIAGNOSIS — K219 Gastro-esophageal reflux disease without esophagitis: Secondary | ICD-10-CM

## 2018-03-10 DIAGNOSIS — Z800 Family history of malignant neoplasm of digestive organs: Secondary | ICD-10-CM

## 2018-03-10 DIAGNOSIS — Z86010 Personal history of colonic polyps: Secondary | ICD-10-CM

## 2018-03-10 DIAGNOSIS — R1013 Epigastric pain: Secondary | ICD-10-CM | POA: Diagnosis not present

## 2018-03-10 MED ORDER — DEXLANSOPRAZOLE 60 MG PO CPDR: 60.0000 mg | DELAYED_RELEASE_CAPSULE | Freq: Every day | ORAL | 0 refills | Status: DC

## 2018-03-10 MED ORDER — SOD PICOSULFATE-MAG OX-CIT ACD 10-3.5-12 MG-GM -GM/160ML PO SOLN: 1.0000 | Freq: Once | ORAL | 0 refills | Status: AC

## 2018-03-10 NOTE — Telephone Encounter (Signed)
[facility] Pre-operative Risk Assessment     Request for surgical clearance:     Endoscopy Procedure  What type of surgery is being performed?     EGD/Colon  When is this surgery scheduled?     04/23/18  What type of clearance is required ?   Cardiac Clerance  Are there any medications that need to be held prior to surgery and how long? None  Practice name and name of physician performing surgery?      [facility] Jackquline Denmark   What is your office phone and fax number?      Phone- [phone]  [phone]  Anesthesia type (None, local, MAC, general) ?       MAC

## 2018-03-10 NOTE — Patient Instructions (Signed)
If you are age 69 or older, your body mass index should be between 23-30. Your Body mass index is 30.56 kg/m. If this is out of the aforementioned range listed, please consider follow up with your Primary Care Provider.  If you are age 36 or younger, your body mass index should be between 19-25. Your Body mass index is 30.56 kg/m. If this is out of the aformentioned range listed, please consider follow up with your Primary Care Provider.   We have sent the following medications to your pharmacy for you to pick up at your convenience: Clenpiq  We have given you samples of the following medication to take: Dexilant 60 mg one by mouth x 2weeks.    You have been scheduled for an endoscopy and colonoscopy. Please follow the written instructions given to you at your visit today. Please pick up your prep supplies at the pharmacy within the next 1-3 days. If you use inhalers (even only as needed), please bring them with you on the day of your procedure. Your physician has requested that you go to www.startemmi.com and enter the access code given to you at your visit today. This web site gives a general overview about your procedure. However, you should still follow specific instructions given to you by our office regarding your preparation for the procedure.   Thank you,  Dr. Jackquline Denmark

## 2018-03-10 NOTE — Progress Notes (Signed)
Chief Complaint: Abdominal pain  Referring Provider:  Maggie Schwalbe, PA-C      ASSESSMENT AND PLAN;   #1.  GERD with intermittent dysphagia despite of Nexium 40 mg p.o. once a day, With associated epigastric pain. Neg CT per notes 2019 Except for ventral hernia. Pt is s/p cholecystectomy with normal LFTs and normal lipase.  #2.  History of colonic polyps #3.  Family history of colon cancer #4.  Chronic constipation (with element of opioid-induced constipation)  Plan: -Switch Nexium to Dexilant 60 mg p.o. once a day for 2 weeks and then go back on Nexium 40 mg p.o. once a day.  She will be given samples. -Proceed with EGD and colonoscopy after cardiology clearance (patient has history of thoracic aortic aneurysm and CAD). She will be given a 2-day preparation.  I discussed risks and benefits including small but definite risks of colonic perforation requiring laparotomy, bleeding after polypectomy, really missing colorectal neoplasms.  Benefits also discussed.  She wishes to proceed. -Continue MiraLAX 17 g p.o. once a day   HPI:    TAHIRY SPICER is a 67 y.o. female  With occasional epigastric pain and "jaw hurting".  She is status post laparoscopic cholecystectomy.  Had normal liver function tests and lipase.  Underwent CT scan of the abdomen which was negative except for ventral hernia.  According to the notes. Has been having problems with dysphagia, Mostly to solids Constipation better with miralax. No rectal bleeding or diarrhea.. Has been on nexium 40mg  po qd. Quit smoking 2017 Sees Dr Geraldo Pitter - had stress test last year in April 2018- negative per pt. Has been on nexium  Colonoscopy 02/28/2014: Up to mid transverse colon (PCF, then with EGD scope), moderate to severe sigmoid diverticulosis 1 cm descending colonic tubular adenoma, limited preparation. Past Medical History:  Diagnosis Date  . Anxiety   . Arthritis   . Bronchitis    hx  . COPD (chronic obstructive  pulmonary disease) ([place])   . Depression   . 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 Dyspnea    occasionally  . GERD (gastroesophageal reflux disease)   . Hypertension   . Hypothyroidism   . Pneumonia    hx  . TIA (transient ischemic attack)     Past Surgical History:  Procedure Laterality Date  . ABDOMINAL HYSTERECTOMY    . CARDIAC CATHETERIZATION     02/05/12 LHC ([facility]): 25% mid RCA, NL LVF.   [facility] CHOLECYSTECTOMY    . COLONOSCOPY  02/28/2014   Colonic polyp status post polypectomy. Tubular adenoma.   Marland Kitchen goiter surgery left side of neck    . THYROIDECTOMY    . TOTAL HIP ARTHROPLASTY Right   . TOTAL HIP ARTHROPLASTY Left 12/02/2016   Procedure: TOTAL HIP ARTHROPLASTY ANTERIOR APPROACH;  Surgeon: Frederik Pear, MD;  Location: [place];  Service: Orthopedics;  Laterality: Left;  . TOTAL SHOULDER ARTHROPLASTY Right     History reviewed. No pertinent family history.  Social History   Tobacco Use  . Smoking status: Former Research scientist (life sciences)  . Smokeless tobacco: Former Systems developer  . Tobacco comment: quit 08/2016  Substance Use Topics  . Alcohol use: No  . Drug use: Yes    Frequency: 7.0 times per week    Types: Marijuana    Comment: last time 11/26/2016    Current Outpatient Medications  Medication Sig Dispense Refill  . albuterol (PROVENTIL HFA;VENTOLIN HFA) 108 (90  Base) MCG/ACT inhaler Inhale 2 puffs into the lungs every 6 (six) hours as needed for wheezing or shortness of breath.    . diclofenac sodium (VOLTAREN) 1 % GEL Apply 1 g topically 4 (four) times daily as needed for pain.  3  . DULoxetine (CYMBALTA) 30 MG capsule Take 30 mg by mouth daily.  2  . esomeprazole (NEXIUM) 40 MG capsule Take 40 mg by mouth daily.  11  . levothyroxine (SYNTHROID, LEVOTHROID) 25 MCG tablet Take 25 mcg by mouth daily before breakfast.  2  . losartan-hydrochlorothiazide (HYZAAR) 50-12.5 MG tablet Take 1 tablet by mouth daily. for blood pressure  2  .  oxyCODONE-acetaminophen (ROXICET) 5-325 MG tablet Take 1-2 tablets by mouth every 4 (four) hours as needed. 60 tablet 0  . traZODone (DESYREL) 100 MG tablet Take 100 mg by mouth at bedtime as needed for sleep.  2   No current facility-administered medications for this visit.     Allergies  Allergen Reactions  . Codeine     Other reaction(s): Other (See Comments) dizzy    Review of Systems:  Constitutional: Denies fever, chills, diaphoresis, appetite change and fatigue.  HEENT: Denies photophobia, eye pain, redness, hearing loss, ear pain, congestion, sore throat, rhinorrhea, sneezing, mouth sores, neck pain, neck stiffness and tinnitus.   Respiratory: Denies SOB, DOE, cough, chest tightness,  and wheezing.   Cardiovascular: Denies chest pain, palpitations and leg swelling.  Genitourinary: Denies dysuria, urgency, frequency, hematuria, flank pain and difficulty urinating.  Musculoskeletal: has myalgias, back pain, joint swelling, arthralgias and gait problem.  Skin: No rash.  Neurological: Denies dizziness, seizures, syncope, weakness, light-headedness, numbness and headaches.  Hematological: Denies adenopathy. Has Easy bruising. Psychiatric/Behavioral: has anxiety or depression     Physical Exam:    Vitals:   03/10/18 0946  BP: 104/74  Pulse: 74   Constitutional:  Well-developed, in no acute distress. Psychiatric: Normal mood and affect. Behavior is normal. HEENT: Pupils normal.  Conjunctivae are normal. No scleral icterus. Neck supple.  Cardiovascular: Normal rate, regular rhythm. No edema Pulmonary/chest: Effort normal and breath sounds normal. No wheezing, rales or rhonchi. Abdominal: Soft, nondistended. Nontender. Bowel sounds active throughout. There are no masses palpable. No hepatomegaly. Ventral hernia Rectal:  defered Neurological: Alert and oriented to person place and time. Skin: Skin is warm and dry. No rashes noted.  Data Reviewed: I have personally reviewed  following labs and imaging studies  CBC: CBC Latest Ref Rng & Units 12/04/2016 12/03/2016 11/26/2016  WBC 4.0 - 10.5 K/uL 11.9(H) 9.2 9.2  Hemoglobin 12.0 - 15.0 g/dL 11.6(L) 11.8(L) 15.2(H)  Hematocrit 36.0 - 46.0 % 35.7(L) 37.1 46.3(H)  Platelets 150 - 400 K/uL 214 213 246    CMP: CMP Latest Ref Rng & Units 12/03/2016 11/26/2016  Glucose 65 - 99 mg/dL 105(H) 120(H)  BUN 6 - 20 mg/dL 14 26(H)  Creatinine 0.44 - 1.00 mg/dL 0.80 0.77  Sodium 135 - 145 mmol/L 139 139  Potassium 3.5 - 5.1 mmol/L 4.0 3.2(L)  Chloride 101 - 111 mmol/L 104 106  CO2 22 - 32 mmol/L 29 25  Calcium 8.9 - 10.3 mg/dL 8.4(L) 8.7(L)      Carmell Austria, MD 03/10/2018, 9:54 AM  Cc: Maggie Schwalbe, PA-C

## 2018-03-13 NOTE — Telephone Encounter (Addendum)
   Primary Cardiologist: Unclear  Chart reviewed as part of pre-operative protocol coverage.  It does not appear the patient is followed by our practice anymore. She was previously followed by Dr. Lennox Pippins but appears to now be followed by a different practice. There are notes in Epic from from Talbert Cage, NP, with [facility] Cardiology Department in 10/2017. That provider had recommended follow-up in 1 year with their cardiology team. That provider is not affiliated with our practice. I will route back to gastroenterology team so they are aware to reach out to the patient's most recent cardiologist. Will also send via epic fax function. Please reach out if we can be of further assistance.   Charlie Pitter, PA-C  03/13/2018, 9:43 AM

## 2018-03-16 NOTE — Telephone Encounter (Signed)
Sent cardiac clearance letter to [employer].

## 2018-03-26 NOTE — Telephone Encounter (Signed)
Called and left message for nurse, faxed cardiac clearance again.

## 2018-04-01 ENCOUNTER — Telehealth: Payer: Self-pay | Admitting: Gastroenterology

## 2018-04-01 NOTE — Telephone Encounter (Signed)
Called and left message for nurse and faxed clearance again.

## 2018-04-01 NOTE — Telephone Encounter (Signed)
Called patient, she will come by the office and pick up a sample.

## 2018-04-01 NOTE — Telephone Encounter (Signed)
Pt called to inform that her insurance does not cover clenpig but do cover PEG [id number]. Pls call pt with another alternative.

## 2018-04-03 NOTE — Telephone Encounter (Signed)
Per Talbert Cage patient is at low risk for her procedure.

## 2018-04-09 ENCOUNTER — Encounter: Payer: Self-pay | Admitting: Gastroenterology

## 2018-04-23 ENCOUNTER — Encounter: Payer: Self-pay | Admitting: Gastroenterology

## 2018-04-23 ENCOUNTER — Ambulatory Visit (AMBULATORY_SURGERY_CENTER): Payer: Medicare HMO | Admitting: Gastroenterology

## 2018-04-23 VITALS — BP 132/75 | HR 63 | Temp 99.1°F | Resp 12 | Ht 63.0 in | Wt 172.0 lb

## 2018-04-23 DIAGNOSIS — R1310 Dysphagia, unspecified: Secondary | ICD-10-CM | POA: Diagnosis not present

## 2018-04-23 DIAGNOSIS — Z86010 Personal history of colonic polyps: Secondary | ICD-10-CM

## 2018-04-23 DIAGNOSIS — R1013 Epigastric pain: Secondary | ICD-10-CM

## 2018-04-23 DIAGNOSIS — D123 Benign neoplasm of transverse colon: Secondary | ICD-10-CM

## 2018-04-23 DIAGNOSIS — Z800 Family history of malignant neoplasm of digestive organs: Secondary | ICD-10-CM | POA: Diagnosis not present

## 2018-04-23 MED ORDER — SODIUM CHLORIDE 0.9 % IV SOLN: 500.0000 mL | Freq: Once | INTRAVENOUS | Status: DC

## 2018-04-23 NOTE — Progress Notes (Signed)
Report given to PACU, vss 

## 2018-04-23 NOTE — Op Note (Signed)
[facility] Patient Name: Judy Cooper Procedure Date: 04/23/2018 1:51 PM MRN: [id number] Endoscopist: Jackquline Denmark , MD Age: 67 Referring MD:  Date of Birth: Jul 12, 1951 Gender: Female Account #: [id number] Procedure:                Upper GI endoscopy Indications:              Dysphagia, GERD Medicines:                Monitored Anesthesia Care Procedure:                Pre-Anesthesia Assessment:                           - Prior to the procedure, a History and Physical                            was performed, and patient medications and                            allergies were reviewed. The patient's tolerance of                            previous anesthesia was also reviewed. The risks                            and benefits of the procedure and the sedation                            options and risks were discussed with the patient.                            All questions were answered, and informed consent                            was obtained. Prior Anticoagulants: The patient has                            taken no previous anticoagulant or antiplatelet                            agents. ASA Grade Assessment: III - A patient with                            severe systemic disease. After reviewing the risks                            and benefits, the patient was deemed in                            satisfactory condition to undergo the procedure.                           After obtaining informed consent, the endoscope was  passed under direct vision. Throughout the                            procedure, the patient's blood pressure, pulse, and                            oxygen saturations were monitored continuously. The                            Endoscope was introduced through the mouth, and                            advanced to the second part of duodenum. The upper                            GI endoscopy was accomplished without  difficulty.                            The patient tolerated the procedure well. Scope In: Scope Out: Findings:                 No endoscopic abnormality was evident in the                            esophagus to explain the patient's complaint of                            dysphagia. It was decided, however, to proceed with                            dilation of the entire esophagus. The scope was                            withdrawn. Dilation was performed with a Maloney                            dilator with no resistance at 48 Fr.                           Three 6 to 8 mm AVMs was noted in the body of the                            stomach and in the fundus. One in the body with                            active oozing. For hemostasis, two hemostatic clips                            were successfully placed (MR conditional). There                            was no bleeding at the end of the procedure.  The examined duodenum was normal. Complications:            No immediate complications. Estimated Blood Loss:     Estimated blood loss: none. Impression:               - No endoscopic esophageal abnormality to explain                            patient's dysphagia. Esophagus dilated. Dilated.                           - Gastric AVMs. One with active oozing s/p                            endoscopic clipping. Recommendation:           - Patient has a contact number available for                            emergencies. The signs and symptoms of potential                            delayed complications were discussed with the                            patient. Return to normal activities tomorrow.                            Written discharge instructions were provided to the                            patient.                           - Post-dilatation diet.                           - Continue present medications. Nexium 40 mg by                             mouth once a day                           - Avoid aspirin, ibuprofen, naproxen, or other                            non-steroidal anti-inflammatory drugs if possible.                           - Return to my office in 6 weeks. Jackquline Denmark, MD 04/23/2018 2:58:38 PM This report has been signed electronically.

## 2018-04-23 NOTE — Patient Instructions (Signed)
Impressions:  Endoscopy: See Dr. Steve Rattler report.  No aspirin, ibuprofen, naproxen, or other non-steroidal anti-inflammatory products for 2 weeks. Tylenol okay if needed until 05/07/18.  Colonoscopy  Polyps (handout given) Diverticulosis (handout given)  YOU HAD AN ENDOSCOPIC PROCEDURE TODAY AT [facility]:   Refer to the procedure report that was given to you for any specific questions about what was found during the examination.  If the procedure report does not answer your questions, please call your gastroenterologist to clarify.  If you requested that your care partner not be given the details of your procedure findings, then the procedure report has been included in a sealed envelope for you to review at your convenience later.  YOU SHOULD EXPECT: Some feelings of bloating in the abdomen. Passage of more gas than usual.  Walking can help get rid of the air that was put into your GI tract during the procedure and reduce the bloating. If you had a lower endoscopy (such as a colonoscopy or flexible sigmoidoscopy) you may notice spotting of blood in your stool or on the toilet paper. If you underwent a bowel prep for your procedure, you may not have a normal bowel movement for a few days.  Please Note:  You might notice some irritation and congestion in your nose or some drainage.  This is from the oxygen used during your procedure.  There is no need for concern and it should clear up in a day or so.  SYMPTOMS TO REPORT IMMEDIATELY:   Following lower endoscopy (colonoscopy or flexible sigmoidoscopy):  Excessive amounts of blood in the stool  Significant tenderness or worsening of abdominal pains  Swelling of the abdomen that is new, acute  Fever of 100F or higher   Following upper endoscopy (EGD)  Vomiting of blood or coffee ground material  New chest pain or pain under the shoulder blades  Painful or persistently difficult swallowing  New shortness of breath  Fever  of 100F or higher  Black, tarry-looking stools  For urgent or emergent issues, a gastroenterologist can be reached at any hour by calling [phone].   DIET:  We do recommend a small meal at first, but then you may proceed to your regular diet.  Drink plenty of fluids but you should avoid alcoholic beverages for 24 hours.  ACTIVITY:  You should plan to take it easy for the rest of today and you should NOT DRIVE or use heavy machinery until tomorrow (because of the sedation medicines used during the test).    FOLLOW UP: Our staff will call the number listed on your records the next business day following your procedure to check on you and address any questions or concerns that you may have regarding the information given to you following your procedure. If we do not reach you, we will leave a message.  However, if you are feeling well and you are not experiencing any problems, there is no need to return our call.  We will assume that you have returned to your regular daily activities without incident.  If any biopsies were taken you will be contacted by phone or by letter within the next 1-3 weeks.  Please call us at [phone] if you have not heard about the biopsies in 3 weeks.    SIGNATURES/CONFIDENTIALITY: You and/or your care partner have signed paperwork which will be entered into your electronic medical record.  These signatures attest to the fact that that the information above on your After  Visit Summary has been reviewed and is understood.  Full responsibility of the confidentiality of this discharge information lies with you and/or your care-partner.

## 2018-04-23 NOTE — Op Note (Signed)
[facility] Patient Name: Judy Cooper Procedure Date: 04/23/2018 1:51 PM MRN: [id number] Endoscopist: Jackquline Denmark , MD Age: 67 Referring MD:  Date of Birth: 1951/02/14 Gender: Female Account #: [id number] Procedure:                Colonoscopy Indications:              High risk colon cancer surveillance: Personal                            history of colonic polyps. Family history of colon                            cancer. History of constipation Medicines:                Monitored Anesthesia Care Procedure:                Pre-Anesthesia Assessment:                           - Prior to the procedure, a History and Physical                            was performed, and patient medications and                            allergies were reviewed. The patient's tolerance of                            previous anesthesia was also reviewed. The risks                            and benefits of the procedure and the sedation                            options and risks were discussed with the patient.                            All questions were answered, and informed consent                            was obtained. Prior Anticoagulants: The patient has                            taken no previous anticoagulant or antiplatelet                            agents. ASA Grade Assessment: III - A patient with                            severe systemic disease. After reviewing the risks                            and benefits, the patient was deemed in  satisfactory condition to undergo the procedure.                           After obtaining informed consent, the colonoscope                            was passed under direct vision. Throughout the                            procedure, the patient's blood pressure, pulse, and                            oxygen saturations were monitored continuously. The                            Endoscope was introduced  through the anus with the                            intention of advancing to the cecum. The adult                            colonoscope scope was advanced to the mid-sigmoid                            colon, therafter we switched EGD scope which could                            only be passed up to mid transverse colon before                            the procedure was aborted. Medications were given.                            The colonoscopy was extremely difficult due to                            multiple diverticula in the left colon, significant                            looping and the patient's body habitus. The patient                            tolerated the procedure fairly well. The quality of                            the bowel preparation was adequate to identify                            polyps 6 mm and larger in size. Scope In: 2:24:52 PM Scope Out: 2:48:02 PM Total Procedure Duration: 0 hours 23 minutes 10 seconds  Findings:                 Two sessile polyps were found in the mid transverse  colon. The polyps were 10 to 12 mm in size. These                            polyps were removed with a hot snare. Resection and                            retrieval were complete. Estimated blood loss: none.                           Four sessile polyps were found in the distal                            transverse colon. The polyps were 6 to 8 mm in                            size. These polyps were removed with a cold snare.                            Resection and retrieval were complete. Estimated                            blood loss was minimal.                           Multiple small and large-mouthed diverticula were                            found in the sigmoid colon and descending colon.                            There was no evidence of diverticular bleeding.                           Non-bleeding internal hemorrhoids were found during                             retroflexion. The hemorrhoids were small. Complications:            No immediate complications. Estimated Blood Loss:     Estimated blood loss: none. Impression:               - Colonic polyps status post polypectomy.                           - Moderate diverticulosis in the sigmoid colon and                            in the descending colon. There was no evidence of                            diverticular bleeding.                           - Otherwise grossly normal colonoscopy up to mid  transverse colon (accomplished using EGD scope). Recommendation:           - Patient has a contact number available for                            emergencies. The signs and symptoms of potential                            delayed complications were discussed with the                            patient. Return to normal activities tomorrow.                            Written discharge instructions were provided to the                            patient.                           - Resume previous diet.                           - Continue present medications.                           - Await pathology results.                           - Needs CT colography in 3-4 weeks to evaluate                            right colon. Jackquline Denmark, MD 04/23/2018 3:12:01 PM This report has been signed electronically.

## 2018-04-23 NOTE — Progress Notes (Signed)
Called to room to assist during endoscopic procedure.  Patient ID and intended procedure confirmed with present staff. Received instructions for my participation in the procedure from the performing physician.  

## 2018-04-24 ENCOUNTER — Telehealth: Payer: Self-pay | Admitting: *Deleted

## 2018-04-24 NOTE — Telephone Encounter (Signed)
  Follow up Call-  Call back number 04/23/2018  Post procedure Call Back phone  # [phone]  Permission to leave phone message Yes     Patient questions:  Do you have a fever, pain , or abdominal swelling? No. Pain Score  0 *  Have you tolerated food without any problems? Yes.    Have you been able to return to your normal activities? Yes.    Do you have any questions about your discharge instructions: Diet   No. Medications  No. Follow up visit  No.  Do you have questions or concerns about your Care? Yes.    Actions: * If pain score is 4 or above: No action needed, pain <4.  Pr reports mild heartburn and belching since endoscopy yesterday.  Advised to try some antacids and to call us back if worsens or doesn't resolve after trying that today.

## 2018-04-27 ENCOUNTER — Other Ambulatory Visit: Payer: Self-pay

## 2018-04-27 ENCOUNTER — Telehealth: Payer: Self-pay

## 2018-04-27 DIAGNOSIS — Z800 Family history of malignant neoplasm of digestive organs: Secondary | ICD-10-CM

## 2018-04-27 DIAGNOSIS — Z86010 Personal history of colonic polyps: Secondary | ICD-10-CM

## 2018-04-27 NOTE — Telephone Encounter (Signed)
Pt scheduled for CT colography at [place] 05/25/18@10am . Pt to pick up the prep at that location 4 days prior to the test. Pt aware.

## 2018-05-01 ENCOUNTER — Encounter: Payer: Self-pay | Admitting: Gastroenterology

## 2018-05-25 ENCOUNTER — Ambulatory Visit
Admission: RE | Admit: 2018-05-25 | Discharge: 2018-05-25 | Disposition: A | Discharge status: Home / Self Care | Payer: Medicare HMO | Source: Ambulatory Visit | Attending: Gastroenterology | Admitting: Gastroenterology

## 2018-05-25 DIAGNOSIS — Z860100 Personal history of colon polyps, unspecified: Secondary | ICD-10-CM

## 2018-05-25 DIAGNOSIS — Z800 Family history of malignant neoplasm of digestive organs: Secondary | ICD-10-CM

## 2018-05-28 DIAGNOSIS — N3281 Overactive bladder: Secondary | ICD-10-CM | POA: Insufficient documentation

## 2018-05-28 DIAGNOSIS — K219 Gastro-esophageal reflux disease without esophagitis: Secondary | ICD-10-CM | POA: Insufficient documentation

## 2018-05-28 HISTORY — DX: Gastro-esophageal reflux disease without esophagitis: K21.9

## 2018-05-28 HISTORY — DX: Overactive bladder: N32.81

## 2018-06-08 ENCOUNTER — Ambulatory Visit: Payer: Medicare HMO | Admitting: Gastroenterology

## 2018-07-21 DIAGNOSIS — S61451A Open bite of right hand, initial encounter: Secondary | ICD-10-CM | POA: Insufficient documentation

## 2018-07-21 DIAGNOSIS — W540XXA Bitten by dog, initial encounter: Secondary | ICD-10-CM

## 2018-07-21 HISTORY — DX: Bitten by dog, initial encounter: W54.0XXA

## 2018-07-21 HISTORY — DX: Open bite of right hand, initial encounter: S61.451A

## 2018-10-30 DIAGNOSIS — R1084 Generalized abdominal pain: Secondary | ICD-10-CM | POA: Insufficient documentation

## 2018-10-30 HISTORY — DX: Generalized abdominal pain: R10.84

## 2018-11-09 DIAGNOSIS — R1013 Epigastric pain: Secondary | ICD-10-CM

## 2018-11-09 HISTORY — DX: Epigastric pain: R10.13

## 2018-11-25 DIAGNOSIS — H9202 Otalgia, left ear: Secondary | ICD-10-CM | POA: Insufficient documentation

## 2018-11-25 DIAGNOSIS — J309 Allergic rhinitis, unspecified: Secondary | ICD-10-CM

## 2018-11-25 HISTORY — DX: Otalgia, left ear: H92.02

## 2018-11-25 HISTORY — DX: Allergic rhinitis, unspecified: J30.9

## 2019-01-29 IMAGING — CT CT VIRTUAL COLONOSCOPY DIAGNOSTIC
3 of 13 series · 11 of 46 positions shown, 17 images · non-contrast
Comparison: 12/30/2017 CT abdomen and pelvis

CLINICAL DATA: Incomplete colonoscopy due to tortuous colon

EXAM:
CT VIRTUAL COLONOSCOPY DIAGNOSTIC
TECHNIQUE: The patient was given a standard bowel preparation with Gastrografin
and barium for fluid and stool tagging respectively. The quality of
the bowel preparation is poor. Automated CO2 insufflation of the
colon was performed prior to image acquisition and colonic
distention is poor. Image post processing was used to generate a 3D
endoluminal fly-through projection of the colon and to
electronically subtract stool/fluid as appropriate.

[Series 11: prone colon 1.50 br40 s3 prone thin · axial · 0.75mm/px · z∈[+1146,+1485]mm · 6 of 475 slices shown, 11 images]
[im 68/475  soft-tissue]
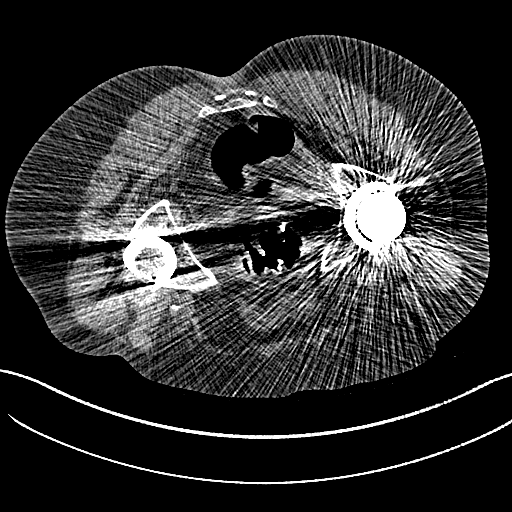
[im 68/475  bone]
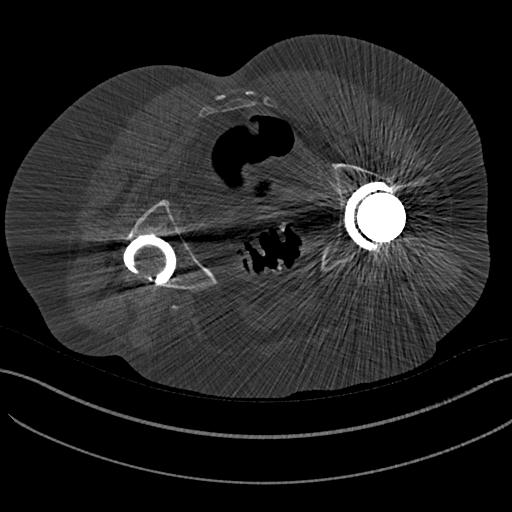
[im 136/475  soft-tissue]
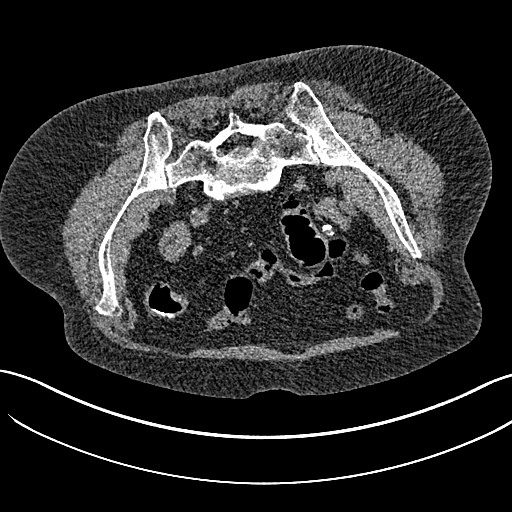
[im 204/475  soft-tissue]
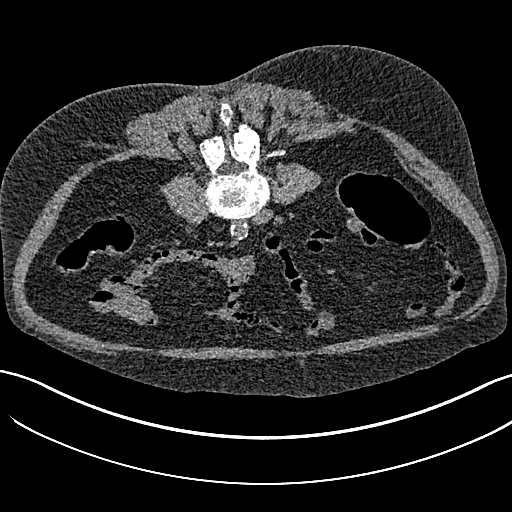
[im 204/475  lung]
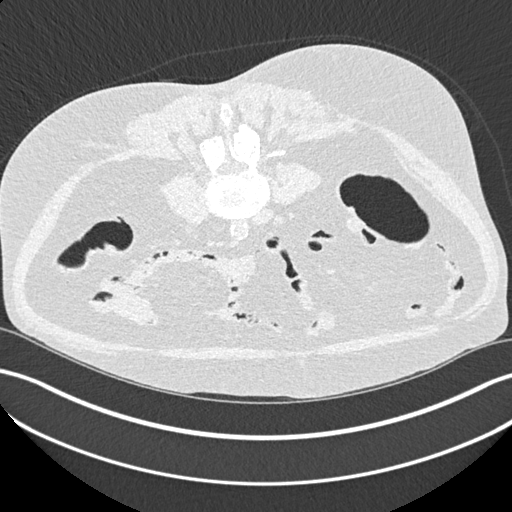
[im 271/475  soft-tissue]
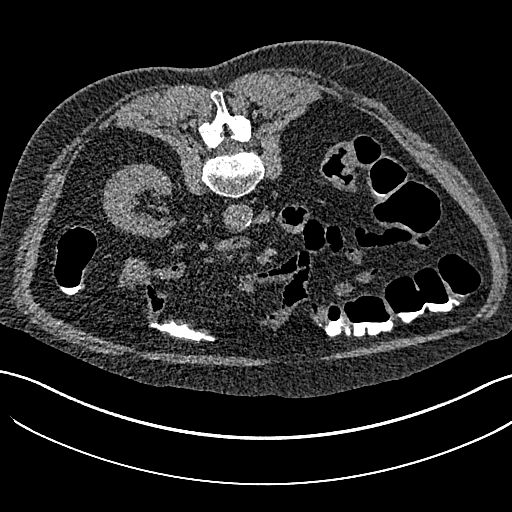
[im 271/475  lung]
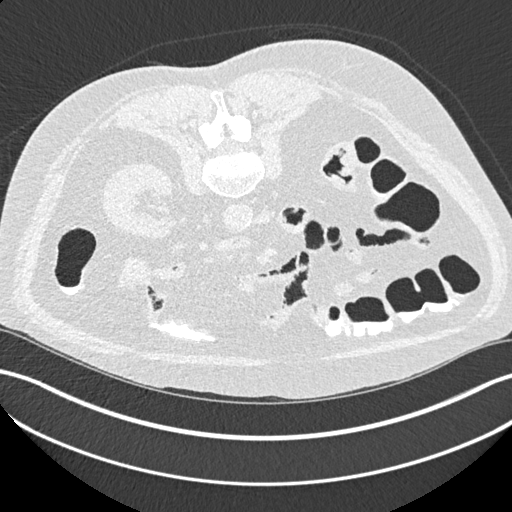
[im 339/475  soft-tissue]
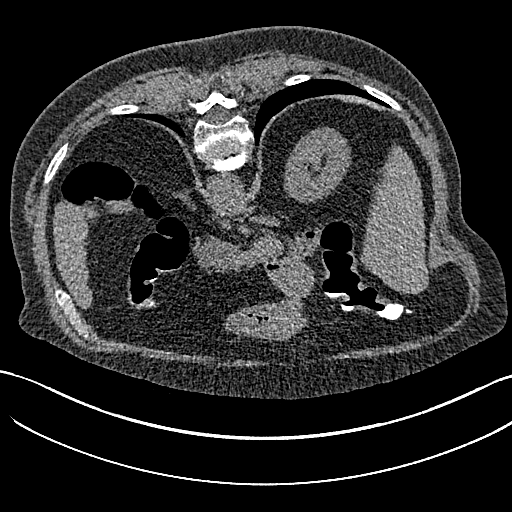
[im 339/475  lung]
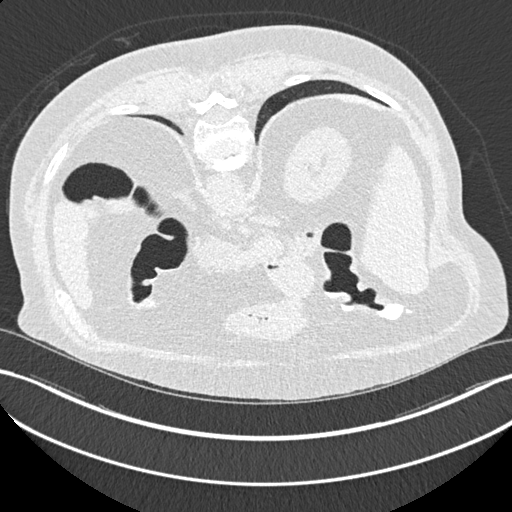
[im 407/475  soft-tissue]
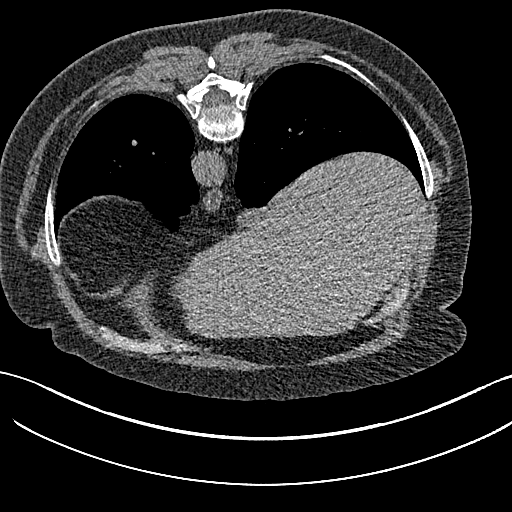
[im 407/475  lung]
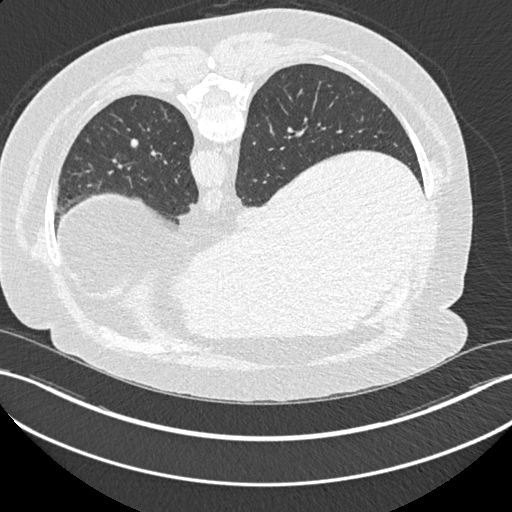

[Series 18: right decub colon 1.50 br40 s3 rt decub thins · axial · 0.78mm/px · z∈[+1169,+1309]mm · 3 of 489 slices shown]
[im 70/489  soft-tissue]
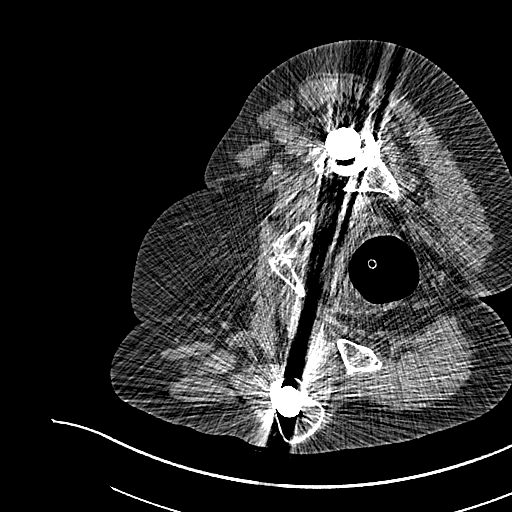
[im 140/489  soft-tissue]
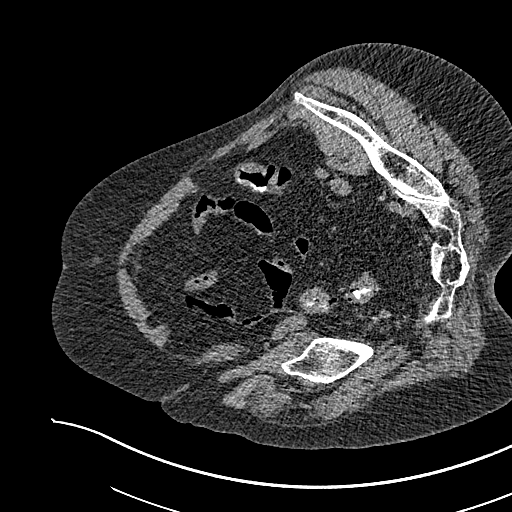
[im 210/489  soft-tissue]
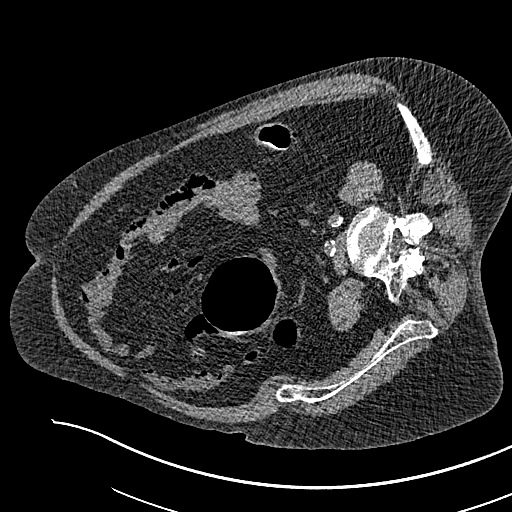

[Series 20: right decub colon 3.00 br40 s3 cor cor rt decub · coronal · 0.72mm/px · 2 of 126 slices shown, 3 images]
[im 42/126  soft-tissue]
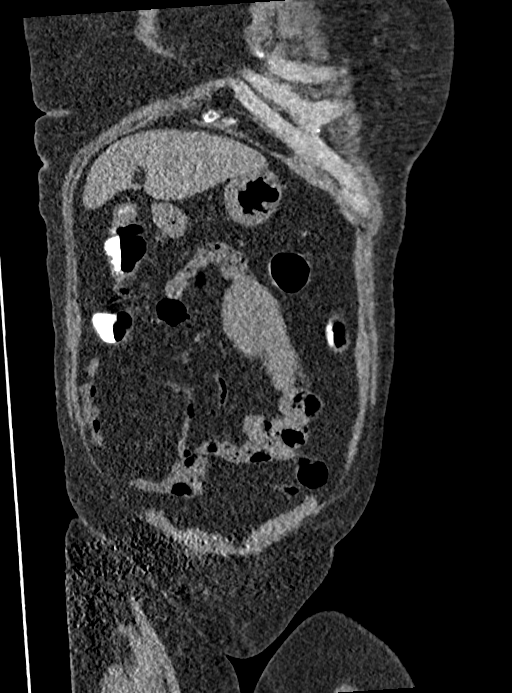
[im 42/126  bone]
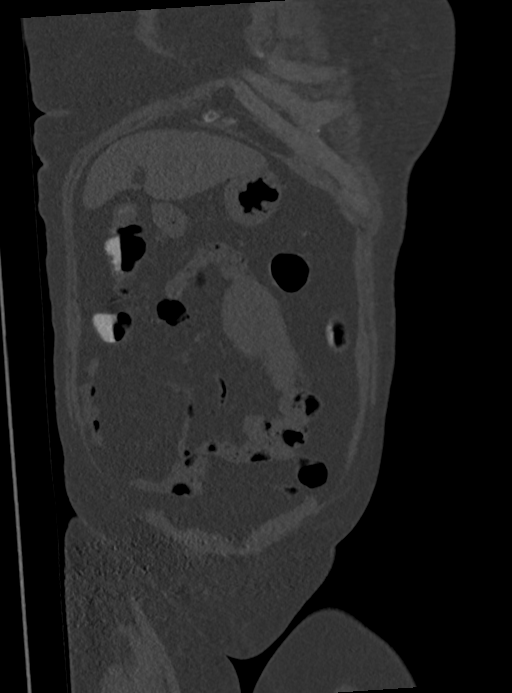
[im 84/126  soft-tissue]
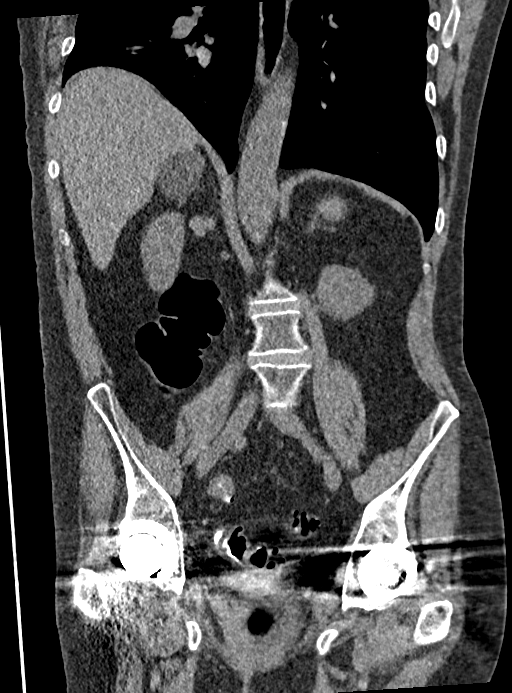

[11 of 46 positions shown; findings below may reference images not displayed]

FINDINGS: VIRTUAL COLONOSCOPY

Suboptimal insufflation of the colon in general, particularly the
descending colon and sigmoid colon. Areas of the right colon near
the hepatic flexure also suboptimally distended. Left colon is
slightly better distended on prone imaging. I see no definite fixed
polypoid filling defect or annular constricting lesion. Moderate to
large amount of retained layering barium throughout the colon.

Virtual colonoscopy is not designed to detect diminutive polyps
(i.e., less than or equal to 5 mm), the presence or absence of which
may not affect clinical management.

CT ABDOMEN AND PELVIS WITHOUT CONTRAST

Lower chest: No acute abnormality

Hepatobiliary: Prior cholecystectomy. No focal hepatic abnormality
or ductal dilatation.

Pancreas: No focal abnormality or ductal dilatation.

Spleen: No focal abnormality.  Normal size.

Adrenals/Urinary Tract: Low-density adrenal mass on the right
measures up to 3.2 cm with a non enhanced density of 7 Hounsfield
units compatible with adenoma. Left adrenal gland and kidneys have
an unremarkable unenhanced appearance. No hydronephrosis. Urinary
bladder cannot be visualized due to beam hardening artifact from
bilateral hip replacements.

Stomach/Bowel: Stomach and small bowel decompressed, unremarkable.

Vascular/Lymphatic: Aortic atherosclerosis. No enlarged abdominal or
pelvic lymph nodes.

Reproductive: No visible adnexal mass. Lower pelvic structures
obscured by beam hardening artifact. Prior hysterectomy.

Other: No free fluid or free air.

Musculoskeletal: No acute bony abnormality. Bilateral hip
replacements noted.
IMPRESSION: Suboptimal study due to moderate to large amount of retained
layering barium and under distention of much of the colon. Extensive
left colonic diverticulosis. No visible fixed polypoid lesion or
annular constricting lesion.

Aortic atherosclerosis.

Stable right adrenal adenoma.

Prior cholecystectomy and hysterectomy.

## 2019-11-23 DIAGNOSIS — R251 Tremor, unspecified: Secondary | ICD-10-CM

## 2019-11-23 HISTORY — DX: Tremor, unspecified: R25.1

## 2019-11-26 DIAGNOSIS — F419 Anxiety disorder, unspecified: Secondary | ICD-10-CM | POA: Insufficient documentation

## 2020-01-12 DIAGNOSIS — R0603 Acute respiratory distress: Secondary | ICD-10-CM

## 2020-01-12 HISTORY — DX: Acute respiratory distress: R06.03

## 2020-01-24 DIAGNOSIS — J449 Chronic obstructive pulmonary disease, unspecified: Secondary | ICD-10-CM

## 2020-01-24 HISTORY — DX: Chronic obstructive pulmonary disease, unspecified: J44.9

## 2020-08-31 DIAGNOSIS — B351 Tinea unguium: Secondary | ICD-10-CM

## 2020-08-31 HISTORY — DX: Tinea unguium: B35.1

## 2020-11-03 DIAGNOSIS — I493 Ventricular premature depolarization: Secondary | ICD-10-CM | POA: Diagnosis not present

## 2020-11-03 DIAGNOSIS — R079 Chest pain, unspecified: Secondary | ICD-10-CM

## 2020-11-03 HISTORY — DX: Chest pain, unspecified: R07.9

## 2020-11-04 DIAGNOSIS — I493 Ventricular premature depolarization: Secondary | ICD-10-CM | POA: Diagnosis not present

## 2020-11-09 DIAGNOSIS — Z720 Tobacco use: Secondary | ICD-10-CM | POA: Insufficient documentation

## 2020-11-09 DIAGNOSIS — Z789 Other specified health status: Secondary | ICD-10-CM

## 2020-11-09 HISTORY — DX: Other specified health status: Z78.9

## 2020-11-09 HISTORY — DX: Tobacco use: Z72.0

## 2020-12-01 ENCOUNTER — Encounter: Payer: Self-pay | Admitting: *Deleted

## 2020-12-01 ENCOUNTER — Encounter: Payer: Self-pay | Admitting: Cardiology

## 2021-01-01 DIAGNOSIS — F32A Depression, unspecified: Secondary | ICD-10-CM | POA: Insufficient documentation

## 2021-01-01 DIAGNOSIS — T7840XA Allergy, unspecified, initial encounter: Secondary | ICD-10-CM | POA: Insufficient documentation

## 2021-01-01 DIAGNOSIS — J189 Pneumonia, unspecified organism: Secondary | ICD-10-CM | POA: Insufficient documentation

## 2021-01-01 DIAGNOSIS — G459 Transient cerebral ischemic attack, unspecified: Secondary | ICD-10-CM | POA: Insufficient documentation

## 2021-01-01 DIAGNOSIS — M1990 Unspecified osteoarthritis, unspecified site: Secondary | ICD-10-CM | POA: Insufficient documentation

## 2021-01-01 DIAGNOSIS — J40 Bronchitis, not specified as acute or chronic: Secondary | ICD-10-CM | POA: Insufficient documentation

## 2021-01-01 DIAGNOSIS — R0600 Dyspnea, unspecified: Secondary | ICD-10-CM | POA: Insufficient documentation

## 2021-01-01 DIAGNOSIS — I77810 Thoracic aortic ectasia: Secondary | ICD-10-CM | POA: Insufficient documentation

## 2021-01-02 ENCOUNTER — Ambulatory Visit: Payer: Self-pay | Admitting: Cardiology

## 2021-01-29 ENCOUNTER — Ambulatory Visit: Payer: Medicare HMO | Admitting: Cardiology

## 2021-01-29 ENCOUNTER — Encounter: Payer: Self-pay | Admitting: Cardiology

## 2021-01-29 ENCOUNTER — Other Ambulatory Visit: Payer: Self-pay

## 2021-01-29 VITALS — BP 140/100 | HR 76 | Ht 63.0 in | Wt 155.6 lb

## 2021-01-29 DIAGNOSIS — F17210 Nicotine dependence, cigarettes, uncomplicated: Secondary | ICD-10-CM | POA: Diagnosis not present

## 2021-01-29 DIAGNOSIS — I10 Essential (primary) hypertension: Secondary | ICD-10-CM

## 2021-01-29 DIAGNOSIS — I712 Thoracic aortic aneurysm, without rupture: Secondary | ICD-10-CM

## 2021-01-29 DIAGNOSIS — I7121 Aneurysm of the ascending aorta, without rupture: Secondary | ICD-10-CM

## 2021-01-29 DIAGNOSIS — Z720 Tobacco use: Secondary | ICD-10-CM

## 2021-01-29 DIAGNOSIS — E7800 Pure hypercholesterolemia, unspecified: Secondary | ICD-10-CM

## 2021-01-29 DIAGNOSIS — I2510 Atherosclerotic heart disease of native coronary artery without angina pectoris: Secondary | ICD-10-CM | POA: Diagnosis not present

## 2021-01-29 DIAGNOSIS — I77810 Thoracic aortic ectasia: Secondary | ICD-10-CM | POA: Diagnosis not present

## 2021-01-29 NOTE — Patient Instructions (Signed)

## 2021-01-29 NOTE — Progress Notes (Signed)
Cardiology Office Note:    Date:  01/29/2021   ID:  Judy Cooper, DOB 01-27-1951, MRN [id number]  PCP:  Maggie Schwalbe, PA-C  Cardiologist:  Jenean Lindau, MD   Referring MD: Maggie Schwalbe, PA-C    ASSESSMENT:    1. Coronary artery disease involving native coronary artery of native heart without angina pectoris   2. Dilation of thoracic aorta ([place])   3. Essential hypertension   4. Hypercholesteremia   5. Thoracic ascending aortic aneurysm ([place])   6. Tobacco abuse    PLAN:    In order of problems listed above:  Coronary artery disease: Secondary prevention stressed with patient.  Importance of compliance with diet medication stressed and she vocalized understanding. Essential hypertension: Blood pressure is stable and diet was emphasized. Mixed dyslipidemia: Patient on statin therapy.  Lipids reviewed.  Lifestyle modification urged. Cigarette smoker: I spent 5 minutes with the patient discussing solely about smoking. Smoking cessation was counseled. I suggested to the patient also different medications and pharmacological interventions. Patient is keen to try stopping on its own at this time. He will get back to me if he needs any further assistance in this matter. Thoracic ascending aortic aneurysm: 4.1 cm by recent CT scan.  I reviewed that record from [facility] and discussed with the patient at length.  Questions were answered to her satisfaction. Patient will be seen in follow-up appointment in 6 months or earlier if the patient has any concerns    Medication Adjustments/Labs and Tests Ordered: Current medicines are reviewed at length with the patient today.  Concerns regarding medicines are outlined above.  Orders Placed This Encounter  Procedures   EKG 12-Lead   No orders of the defined types were placed in this encounter.    No chief complaint on file.    History of Present Illness:    Judy Cooper is a 70 y.o. female.  Patient has past  medical history of coronary artery disease, ascending aortic aneurysm, essential hypertension, dyslipidemia and unfortunately continues to smoke.  She leads a sedentary lifestyle.  This patient has been under my care in my previous practice.  He is here now to transfer his care and be established with my current practice.  She denies any chest pain orthopnea or PND.  She was admitted to [facility] and treated and released and I reviewed records including EKG and echocardiogram.  At the time of my evaluation, the patient is alert awake oriented and in no distress.  Past Medical History:  Diagnosis Date   Adenoma of right adrenal gland 01/14/2018   Formatting of this note might be different from the original. 3.8 x 2.9 cm right adrenal nodule on CT in May 2019 at [facility] [place] benign. 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   Allergic rhinitis with postnasal drip 11/25/2018   Allergy    Anxiety    Arthritis    Bronchitis    hx   Chest pain 11/03/2020   Chronic bilateral low back pain with bilateral sciatica 06/05/2016   Chronic kidney disease 2019   abnormal lab tests currently being diagnosed per kidney doctor   Chronic pain of right knee 08/14/2017   Constipation 12/12/2017   COPD (chronic obstructive pulmonary disease) ([place])    Coronary artery disease involving native coronary artery of native heart 11/06/2016   Depression    Dilation of thoracic aorta (HCC)    4 cm  ascending thoracic aorta 11/25/16 CT [facility])   Dog bite of hand without complication, right, initial encounter 07/21/2018   Dyslipidemia 11/20/2016   Dyspepsia 11/09/2018   Dyspnea    occasionally   Elevated hemoglobin A1c 07/01/2016   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 Overview:  Added  automatically from request for surgery [id number] Formatting of this note might be different from the original. Added automatically from request for surgery [id number]   Gastroesophageal reflux disease without esophagitis 05/28/2018   Generalized abdominal pain 10/30/2018   Hypercholesteremia 08/20/2017   Hypothyroid 07/01/2016   Insomnia 07/01/2016   Left ear pain 11/25/2018   Multiple lung nodules on CT 12/02/2016   Formatting of this note might be different from the original. DUE FOR REPEAT NON CONTRAST CT SCAN OF CHEST IN April 2019   Onychomycosis of left great toe 08/31/2020   Overactive bladder 05/28/2018   Pneumonia    hx   Poor historian 11/09/2020   Prediabetes 08/20/2017   Primary localized osteoarthritis of left hip 12/02/2016   Primary osteoarthritis of left hip 11/27/2016   Respiratory distress, acute 01/12/2020   Respiratory infection 06/06/2017   Short-term memory loss 01/14/2018   Stroke ([place]) 2011   patient states she had a mini stroke, no residual impairment per patient   Thoracic ascending aortic aneurysm ([place]) 10/24/2017   TIA (transient ischemic attack)    Tobacco abuse 11/09/2020   Tobacco dependence 06/05/2016   Tremors of nervous system 11/23/2019   Ventral hernia without obstruction or gangrene 12/12/2017    Past Surgical History:  Procedure Laterality Date   ABDOMINAL HYSTERECTOMY     CARDIAC CATHETERIZATION     02/05/12 LHC ([facility]): 25% mid RCA, NL LVF.    CHOLECYSTECTOMY     COLONOSCOPY  02/28/2014   Colonic polyp status post polypectomy. Tubular adenoma.    goiter surgery left side of neck     THYROIDECTOMY     TOTAL HIP ARTHROPLASTY Right    TOTAL HIP ARTHROPLASTY Left 12/02/2016   Procedure: TOTAL HIP ARTHROPLASTY ANTERIOR APPROACH;  Surgeon: Frederik Pear, MD;  Location: [facility];  Service: Orthopedics;  Laterality: Left;   TOTAL SHOULDER ARTHROPLASTY Right     Current Medications: Current Meds  Medication Sig   albuterol (PROVENTIL HFA;VENTOLIN HFA) 108 (90 Base)  MCG/ACT inhaler Inhale 2 puffs into the lungs every 6 (six) hours as needed for wheezing or shortness of breath.   atorvastatin (LIPITOR) 20 MG tablet Take 1 tablet by mouth daily.   cephALEXin (KEFLEX) 500 MG capsule Take 500 mg by mouth 4 (four) times daily.   diazepam (VALIUM) 5 MG tablet Take 5 mg by mouth daily as needed for anxiety.   diclofenac sodium (VOLTAREN) 1 % GEL Apply 1 g topically 4 (four) times daily as needed for pain.   esomeprazole (NEXIUM) 40 MG capsule Take 40 mg by mouth daily.   Fluticasone-Umeclidin-Vilant 100-62.5-25 MCG/INH AEPB Inhale 1 puff into the lungs daily.   ipratropium-albuterol (DUONEB) 0.5-2.5 (3) MG/3ML SOLN Take 3 mLs by nebulization every 6 (six) hours as needed for wheezing or shortness of breath.   levothyroxine (SYNTHROID, LEVOTHROID) 25 MCG tablet Take 25 mcg by mouth daily before breakfast.   losartan-hydrochlorothiazide (HYZAAR) 50-12.5 MG tablet Take 1 tablet by mouth daily. for blood pressure   oxybutynin (DITROPAN) 5 MG tablet Take 1  tablet by mouth daily.   oxyCODONE-acetaminophen (PERCOCET/ROXICET) 5-325 MG tablet Take 1 tablet by mouth 2 (two) times daily as needed for pain.   psyllium (METAMUCIL) 58.6 % packet Take 1 packet by mouth daily.   traZODone (DESYREL) 100 MG tablet Take 100 mg by mouth at bedtime as needed for sleep.   vitamin B-12 (CYANOCOBALAMIN) 1000 MCG tablet Take 1,000 mcg by mouth daily.     Allergies:   Codeine and Pregabalin   Social History   Socioeconomic History   Marital status: Married    Spouse name: Not on file   Number of children: Not on file   Years of education: Not on file   Highest education level: Not on file  Occupational History   Not on file  Tobacco Use   Smoking status: Every Day    Pack years: 0.00    Types: Cigarettes    Last attempt to quit: 08/13/2015    Years since quitting: 5.4   Smokeless tobacco: Former   Tobacco comments:    quit 08/2016  Vaping Use   Vaping Use: Never used   Substance and Sexual Activity   Alcohol use: No   Drug use: Yes    Frequency: 7.0 times per week    Types: Marijuana    Comment: last time 11/26/2016   Sexual activity: Not on file  Other Topics Concern   Not on file  Social History Narrative   Not on file   Social Determinants of Health   Financial Resource Strain: Not on file  Food Insecurity: Not on file  Transportation Needs: Not on file  Physical Activity: Not on file  Stress: Not on file  Social Connections: Not on file     Family History: The patient's family history includes Cancer in her father; Colon cancer in her brother; Diabetes in her brother and sister; Hypertension in her brother and mother; Stomach cancer in her maternal grandmother. There is no history of Esophageal cancer or Rectal cancer.  ROS:   Please see the history of present illness.    All other systems reviewed and are negative.  EKGs/Labs/Other Studies Reviewed:    The following studies were reviewed today: I discussed my findings with the patient at length.  EKG reveals sinus rhythm and nonspecific ST-T changes.   Recent Labs: No results found for requested labs within last 8760 hours.  Recent Lipid Panel No results found for: CHOL, TRIG, HDL, CHOLHDL, VLDL, LDLCALC, LDLDIRECT  Physical Exam:    VS:  BP (!) 140/100   Pulse 76   Ht 5\' 3"  (1.6 m)   Wt 155 lb 9.6 oz (70.6 kg)   SpO2 98%   BMI 27.56 kg/m     Wt Readings from Last 3 Encounters:  01/29/21 155 lb 9.6 oz (70.6 kg)  11/10/20 158 lb (71.7 kg)  04/23/18 172 lb (78 kg)     GEN: Patient is in no acute distress HEENT: Normal NECK: No JVD; No carotid bruits LYMPHATICS: No lymphadenopathy CARDIAC: Hear sounds regular, 2/6 systolic murmur at the apex. RESPIRATORY:  Clear to auscultation without rales, wheezing or rhonchi  ABDOMEN: Soft, non-tender, non-distended MUSCULOSKELETAL:  No edema; No deformity  SKIN: Warm and dry NEUROLOGIC:  Alert and oriented x 3 PSYCHIATRIC:   Normal affect   Signed, Jenean Lindau, MD  01/29/2021 11:25 AM    [facility]

## 2021-06-28 ENCOUNTER — Other Ambulatory Visit: Payer: Self-pay

## 2021-06-28 ENCOUNTER — Emergency Department (HOSPITAL_COMMUNITY): Payer: Medicare HMO

## 2021-06-28 ENCOUNTER — Emergency Department (HOSPITAL_COMMUNITY)
Admission: EM | Admit: 2021-06-28 | Discharge: 2021-06-29 | Disposition: A | Discharge status: Home / Self Care | Payer: Medicare HMO | Attending: Emergency Medicine | Admitting: Emergency Medicine

## 2021-06-28 ENCOUNTER — Encounter: Payer: Medicare HMO | Admitting: Gastroenterology

## 2021-06-28 ENCOUNTER — Encounter (HOSPITAL_COMMUNITY): Payer: Self-pay | Admitting: Emergency Medicine

## 2021-06-28 DIAGNOSIS — Z5321 Procedure and treatment not carried out due to patient leaving prior to being seen by health care provider: Secondary | ICD-10-CM | POA: Insufficient documentation

## 2021-06-28 DIAGNOSIS — R630 Anorexia: Secondary | ICD-10-CM | POA: Diagnosis not present

## 2021-06-28 DIAGNOSIS — H547 Unspecified visual loss: Secondary | ICD-10-CM | POA: Insufficient documentation

## 2021-06-28 DIAGNOSIS — R220 Localized swelling, mass and lump, head: Secondary | ICD-10-CM | POA: Diagnosis not present

## 2021-06-28 DIAGNOSIS — R221 Localized swelling, mass and lump, neck: Secondary | ICD-10-CM | POA: Insufficient documentation

## 2021-06-28 DIAGNOSIS — R531 Weakness: Secondary | ICD-10-CM | POA: Diagnosis not present

## 2021-06-28 LAB — CBC WITH DIFFERENTIAL/PLATELET
Abs Immature Granulocytes: 0.05 10*3/uL (ref 0.00–0.07)
Basophils Absolute: 0.1 10*3/uL (ref 0.0–0.1)
Basophils Relative: 1 %
Eosinophils Absolute: 0.1 10*3/uL (ref 0.0–0.5)
Eosinophils Relative: 1 %
HCT: 47.7 % — ABNORMAL HIGH (ref 36.0–46.0)
Hemoglobin: 15.7 g/dL — ABNORMAL HIGH (ref 12.0–15.0)
Immature Granulocytes: 1 %
Lymphocytes Relative: 26 %
Lymphs Abs: 2.3 10*3/uL (ref 0.7–4.0)
MCH: 29.2 pg (ref 26.0–34.0)
MCHC: 32.9 g/dL (ref 30.0–36.0)
MCV: 88.7 fL (ref 80.0–100.0)
Monocytes Absolute: 0.7 10*3/uL (ref 0.1–1.0)
Monocytes Relative: 8 %
Neutro Abs: 5.7 10*3/uL (ref 1.7–7.7)
Neutrophils Relative %: 63 %
Platelets: 255 10*3/uL (ref 150–400)
RBC: 5.38 MIL/uL — ABNORMAL HIGH (ref 3.87–5.11)
RDW: 14.8 % (ref 11.5–15.5)
WBC: 8.8 10*3/uL (ref 4.0–10.5)
nRBC: 0 % (ref 0.0–0.2)

## 2021-06-28 LAB — BASIC METABOLIC PANEL
Anion gap: 10 (ref 5–15)
BUN: 16 mg/dL (ref 8–23)
CO2: 26 mmol/L (ref 22–32)
Calcium: 9.1 mg/dL (ref 8.9–10.3)
Chloride: 99 mmol/L (ref 98–111)
Creatinine, Ser: 0.69 mg/dL (ref 0.44–1.00)
GFR, Estimated: >60 mL/min (ref >60)
Glucose, Bld: 108 mg/dL — ABNORMAL HIGH (ref 70–99)
Potassium: 3.3 mmol/L — ABNORMAL LOW (ref 3.5–5.1)
Sodium: 135 mmol/L (ref 135–145)

## 2021-06-28 NOTE — ED Triage Notes (Addendum)
Pt presents to ED POV. Pt c/o lumps under skin all over body that began 4w ago. Pt reports since then she has been feeling weak, fatigued since they began. Pt reports that 2d ago she woke up and sight out of L eye was compromised. PCP placed pt on antibiotics and s/s have not alleviated

## 2021-06-28 NOTE — ED Provider Notes (Signed)
Emergency Medicine Provider Triage Evaluation Note  Judy Cooper , a 70 y.o. female  was evaluated in triage.  Pt complains of painless vision loss that started 2 days ago. Seen by ophtho pta and note indicated possible concern for optic neuritis. She was sent here for further eval. Prior to onset of vision loss pt has been experiences painful lumps to her head/neck. She reports decreased appetite and generalized weakness  Review of Systems  Positive: Vision loss, nodules to skin Negative: fevers  Physical Exam  BP (!) 148/102 (BP Location: Left Arm)   Pulse 82   Temp (!) 97.4 F (36.3 C)   Resp 18   SpO2 96%  Gen:   Awake, no distress   Resp:  Normal effort  MSK:   Moves extremities without difficulty  Other:  Lymphadenopathy noted to the head/neck. Clear speech, no facial droop, 5/5 strength to the bue/ble  Medical Decision Making  Medically screening exam initiated at 6:01 PM.  Appropriate orders placed.  Judy Cooper was informed that the remainder of the evaluation will be completed by another provider, this initial triage assessment does not replace that evaluation, and the importance of remaining in the ED until their evaluation is complete.     Rodney Booze, PA-C 06/28/21 1804    Pattricia Boss, MD 07/01/21 1104

## 2021-06-29 NOTE — ED Notes (Signed)
Pt left due to wait time  

## 2021-07-12 DEATH — deceased
